# Patient Record
Sex: Female | Born: 1950 | Race: White | Hispanic: No | Marital: Married
Health system: Midwestern US, Community
[De-identification: ages and names within clinical notes are randomized; demographics above are authoritative.]

## PROBLEM LIST (undated history)

## (undated) DIAGNOSIS — Z1239 Encounter for other screening for malignant neoplasm of breast: Secondary | ICD-10-CM

## (undated) DIAGNOSIS — N838 Other noninflammatory disorders of ovary, fallopian tube and broad ligament: Secondary | ICD-10-CM

## (undated) DIAGNOSIS — Z139 Encounter for screening, unspecified: Secondary | ICD-10-CM

## (undated) DIAGNOSIS — Z1231 Encounter for screening mammogram for malignant neoplasm of breast: Secondary | ICD-10-CM

## (undated) DIAGNOSIS — R52 Pain, unspecified: Secondary | ICD-10-CM

---

## 2010-06-07 LAB — LACTATE DEHYDROGENASE: LD: 224 U/L — ABNORMAL HIGH (ref 100–190)

## 2011-05-19 LAB — LIPID PANEL
Cholesterol, Total: 172 mg/dl (ref 100–199)
HDL: 55 mg/dl
LDL Calculated: 106 mg/dl
Triglycerides: 54 mg/dl (ref 0–199)

## 2011-05-19 LAB — GLUCOSE, RANDOM: Glucose: 91 mg/dl (ref 70–108)

## 2012-02-14 LAB — CBC
Hematocrit: 37.6 % (ref 37.0–47.0)
Hemoglobin: 12.7 gm/dl (ref 12.0–16.0)
MCH: 32 pg — ABNORMAL HIGH (ref 27.0–31.0)
MCHC: 33.8 gm/dl (ref 33.0–37.0)
MCV: 94.8 fL (ref 81.0–99.0)
MPV: 9.1 mcm (ref 7.4–10.4)
Platelets: 198 10*3/uL (ref 130–400)
RBC: 3.96 10*6/uL — ABNORMAL LOW (ref 4.20–5.40)
RDW: 12.8 % (ref 11.5–14.5)
WBC: 6.4 10*3/uL (ref 4.8–10.8)

## 2012-02-14 LAB — SEDIMENTATION RATE: Sed Rate: 5 mm/hr (ref 0–20)

## 2012-02-17 LAB — ANA SCREEN WITH REFLEX: ANA SCREEN: NOT DETECTED

## 2012-02-17 LAB — TISSUE TRANSGLUTAMINASE, IGA

## 2014-09-21 ENCOUNTER — Encounter

## 2014-09-21 ENCOUNTER — Inpatient Hospital Stay: Admit: 2014-09-21 | Attending: Family Medicine

## 2014-09-21 DIAGNOSIS — Z139 Encounter for screening, unspecified: Secondary | ICD-10-CM

## 2016-03-22 ENCOUNTER — Inpatient Hospital Stay: Admit: 2016-03-22 | Attending: Family Medicine

## 2016-03-22 ENCOUNTER — Encounter

## 2016-03-22 DIAGNOSIS — Z139 Encounter for screening, unspecified: Secondary | ICD-10-CM

## 2016-10-11 ENCOUNTER — Inpatient Hospital Stay: Admit: 2016-10-11 | Payer: BLUE CROSS/BLUE SHIELD

## 2016-10-11 ENCOUNTER — Encounter

## 2016-10-11 DIAGNOSIS — N838 Other noninflammatory disorders of ovary, fallopian tube and broad ligament: Secondary | ICD-10-CM

## 2018-02-24 ENCOUNTER — Encounter

## 2018-02-24 ENCOUNTER — Inpatient Hospital Stay: Admit: 2018-02-24 | Payer: MEDICARE

## 2018-02-24 DIAGNOSIS — Z1231 Encounter for screening mammogram for malignant neoplasm of breast: Secondary | ICD-10-CM

## 2019-02-12 ENCOUNTER — Inpatient Hospital Stay: Payer: MEDICARE | Primary: Family Medicine

## 2019-02-12 DIAGNOSIS — L930 Discoid lupus erythematosus: Secondary | ICD-10-CM

## 2019-02-12 LAB — CBC WITH AUTO DIFFERENTIAL
Basophils Absolute: 0.1 10*3/uL (ref 0.0–0.1)
Basophils: 1.4 %
Eosinophils Absolute: 0.3 10*3/uL (ref 0.0–0.4)
Eosinophils: 6.2 %
Hematocrit: 39.5 % (ref 37.0–47.0)
Hemoglobin: 13.4 gm/dl (ref 12.0–16.0)
Immature Grans (Abs): 0.01 10*3/uL (ref 0.00–0.07)
Immature Granulocytes: 0.2 %
Lymphocytes Absolute: 0.5 10*3/uL — ABNORMAL LOW (ref 1.0–4.8)
Lymphocytes: 10.8 %
MCH: 31.9 pg (ref 26.0–33.0)
MCHC: 33.9 gm/dl (ref 32.2–35.5)
MCV: 94 fL (ref 81.0–99.0)
MPV: 10.6 fL (ref 9.4–12.4)
Monocytes Absolute: 0.4 10*3/uL (ref 0.4–1.3)
Monocytes: 8.6 %
Platelets: 206 10*3/uL (ref 130–400)
RBC: 4.2 10*6/uL (ref 4.20–5.40)
RDW-CV: 12.8 % (ref 11.5–14.5)
RDW-SD: 43.6 fL (ref 35.0–45.0)
Seg Neutrophils: 72.8 %
Segs Absolute: 3.6 10*3/uL (ref 1.8–7.7)
WBC: 5 10*3/uL (ref 4.8–10.8)
nRBC: 0 /100 wbc

## 2019-02-12 LAB — COMPREHENSIVE METABOLIC PANEL
ALT: 17 U/L (ref 11–66)
AST: 24 U/L (ref 5–40)
Albumin: 4.7 g/dL (ref 3.5–5.1)
Alkaline Phosphatase: 66 U/L (ref 38–126)
BUN: 9 mg/dL (ref 7–22)
CO2: 27 meq/L (ref 23–33)
Calcium: 9 mg/dL (ref 8.5–10.5)
Chloride: 97 meq/L — ABNORMAL LOW (ref 98–111)
Creatinine: 0.7 mg/dL (ref 0.4–1.2)
Glucose: 96 mg/dL (ref 70–108)
Potassium: 3.8 meq/L (ref 3.5–5.2)
Sodium: 134 meq/L — ABNORMAL LOW (ref 135–145)
Total Bilirubin: 0.8 mg/dL (ref 0.3–1.2)
Total Protein: 7.2 g/dL (ref 6.1–8.0)

## 2019-02-12 LAB — VITAMIN D 25 HYDROXY: Vit D, 25-Hydroxy: 48 ng/ml (ref 30–100)

## 2019-02-12 LAB — SEDIMENTATION RATE: Sed Rate: 6 mm/hr (ref 0–20)

## 2019-02-12 LAB — C-REACTIVE PROTEIN: CRP: <0.03 mg/dl (ref 0.00–1.00)

## 2019-02-12 LAB — ANION GAP: Anion Gap: 10 meq/L (ref 8.0–16.0)

## 2019-02-12 LAB — GLOMERULAR FILTRATION RATE, ESTIMATED: Est, Glom Filt Rate: 84 mL/min/{1.73_m2} — AB

## 2019-02-13 LAB — ANTI-CENTROMERE AB: CENTROMERE ANTIBODY: 6 U/mL (ref <100)

## 2019-02-14 LAB — CARDIOLIPIN ANTIBODY, IGG: Cardiolipin Antibody, IgG: 4 [GPL'U] (ref 0–14)

## 2019-02-14 LAB — C4 COMPLEMENT: C4 Complement: 23 mg/dL (ref 10–40)

## 2019-02-14 LAB — C3 COMPLEMENT: C3 Complement: 51 mg/dL — ABNORMAL LOW (ref 88–201)

## 2019-02-14 LAB — ANTI-DNA ANTIBODY, DOUBLE-STRANDED

## 2019-02-14 LAB — ANA SCREEN WITH REFLEX: ANA SCREEN: NOT DETECTED

## 2019-02-15 LAB — LUPUS (LE) PANEL W/ REFLEX
PTT 1:1 Mix: 48 s (ref 32–48)
PTT Lupus Anticoagulant: 64 s — ABNORMAL HIGH (ref 32–48)
Protime: 12.4 s (ref 12.0–15.5)
Thrombin Time: 17.5 s (ref 14.7–19.5)
dRVVT Screen: 28 s — ABNORMAL LOW (ref 33–44)

## 2019-02-15 LAB — SJOGREN'S ANTIBODIES (SS-A, SS-B): Anti SSB: 0 AU/mL (ref 0–40)

## 2019-02-15 LAB — COMPLEMENT, TOTAL: Compl, Total (CH50): 82 CAE Units (ref 60–144)

## 2019-02-15 LAB — SMITH (ENA) ANTIBODY IGG: Anti-Smith: 1 AU/mL (ref 0–40)

## 2019-02-15 LAB — ANTI-RNP (ENA AB): Anti RNP: 6 AU/mL (ref 0–40)

## 2019-02-16 LAB — SCLERODERMA (SCL-70) (ENA) AB: Scleroderma SCL-70: 36 U/mL (ref <100)

## 2019-07-15 ENCOUNTER — Inpatient Hospital Stay: Admit: 2019-07-15 | Payer: MEDICARE | Primary: Family Medicine

## 2019-07-15 ENCOUNTER — Encounter

## 2019-07-15 DIAGNOSIS — Z1231 Encounter for screening mammogram for malignant neoplasm of breast: Secondary | ICD-10-CM

## 2020-04-12 ENCOUNTER — Inpatient Hospital Stay: Payer: MEDICARE | Primary: Family Medicine

## 2020-04-12 DIAGNOSIS — M810 Age-related osteoporosis without current pathological fracture: Secondary | ICD-10-CM

## 2020-04-12 LAB — SEDIMENTATION RATE: Sed Rate: 5 mm/hr (ref 0–20)

## 2020-04-13 LAB — CBC WITH AUTO DIFFERENTIAL
Basophils Absolute: 0.1 10*3/uL (ref 0.0–0.1)
Basophils: 1.1 %
Eosinophils Absolute: 0.3 10*3/uL (ref 0.0–0.4)
Eosinophils: 4.8 %
Hematocrit: 40.3 % (ref 37.0–47.0)
Hemoglobin: 12.9 gm/dl (ref 12.0–16.0)
Immature Grans (Abs): 0.01 10*3/uL (ref 0.00–0.07)
Immature Granulocytes: 0.2 %
Lymphocytes Absolute: 0.7 10*3/uL — ABNORMAL LOW (ref 1.0–4.8)
Lymphocytes: 11.3 %
MCH: 31.9 pg (ref 26.0–33.0)
MCHC: 32 gm/dl — ABNORMAL LOW (ref 32.2–35.5)
MCV: 99.5 fL — ABNORMAL HIGH (ref 81.0–99.0)
MPV: 10.9 fL (ref 9.4–12.4)
Monocytes Absolute: 0.5 10*3/uL (ref 0.4–1.3)
Monocytes: 7.8 %
Platelets: 199 10*3/uL (ref 130–400)
RBC: 4.05 10*6/uL — ABNORMAL LOW (ref 4.20–5.40)
RDW-CV: 12.7 % (ref 11.5–14.5)
RDW-SD: 46.4 fL — ABNORMAL HIGH (ref 35.0–45.0)
Seg Neutrophils: 74.8 %
Segs Absolute: 4.7 10*3/uL (ref 1.8–7.7)
WBC: 6.3 10*3/uL (ref 4.8–10.8)
nRBC: 0 /100 wbc

## 2020-04-13 LAB — GLOMERULAR FILTRATION RATE, ESTIMATED: Est, Glom Filt Rate: 83 mL/min/{1.73_m2} — AB

## 2020-04-13 LAB — CREATININE: Creatinine: 0.7 mg/dL (ref 0.4–1.2)

## 2020-04-13 LAB — ALT: ALT: 16 U/L (ref 11–66)

## 2020-07-19 ENCOUNTER — Inpatient Hospital Stay: Admit: 2020-07-19 | Payer: MEDICARE | Primary: Family Medicine

## 2020-07-19 ENCOUNTER — Encounter

## 2020-07-19 ENCOUNTER — Ambulatory Visit: Payer: MEDICARE | Primary: Family Medicine

## 2020-07-19 DIAGNOSIS — Z1231 Encounter for screening mammogram for malignant neoplasm of breast: Secondary | ICD-10-CM

## 2020-07-19 LAB — SEDIMENTATION RATE: Sed Rate: 6 mm/hr (ref 0–20)

## 2020-07-20 LAB — CBC WITH AUTO DIFFERENTIAL
Basophils Absolute: 0 10*3/uL (ref 0.0–0.1)
Basophils: 0.8 %
Eosinophils Absolute: 0.1 10*3/uL (ref 0.0–0.4)
Eosinophils: 2.3 %
Hematocrit: 40.3 % (ref 37.0–47.0)
Hemoglobin: 13.4 gm/dl (ref 12.0–16.0)
Immature Grans (Abs): 0.02 10*3/uL (ref 0.00–0.07)
Immature Granulocytes: 0.3 %
Lymphocytes Absolute: 0.6 10*3/uL — ABNORMAL LOW (ref 1.0–4.8)
Lymphocytes: 10 %
MCH: 32.4 pg (ref 26.0–33.0)
MCHC: 33.3 gm/dl (ref 32.2–35.5)
MCV: 97.6 fL (ref 81.0–99.0)
MPV: 10.6 fL (ref 9.4–12.4)
Monocytes Absolute: 0.6 10*3/uL (ref 0.4–1.3)
Monocytes: 9.5 %
Platelets: 249 10*3/uL (ref 130–400)
RBC: 4.13 10*6/uL — ABNORMAL LOW (ref 4.20–5.40)
RDW-CV: 13.1 % (ref 11.5–14.5)
RDW-SD: 46.9 fL — ABNORMAL HIGH (ref 35.0–45.0)
Seg Neutrophils: 77.1 %
Segs Absolute: 4.7 10*3/uL (ref 1.8–7.7)
WBC: 6.1 10*3/uL (ref 4.8–10.8)
nRBC: 0 /100 wbc

## 2020-07-20 LAB — ALT: ALT: 17 U/L (ref 11–66)

## 2020-07-20 LAB — VITAMIN D 25 HYDROXY: Vit D, 25-Hydroxy: 78 ng/ml (ref 30–100)

## 2020-07-20 LAB — CALCIUM: Calcium: 9.8 mg/dL (ref 8.5–10.5)

## 2020-07-20 LAB — CREATININE: Creatinine: 0.6 mg/dL (ref 0.4–1.2)

## 2020-07-20 LAB — GLOMERULAR FILTRATION RATE, ESTIMATED: Est, Glom Filt Rate: >90 mL/min/{1.73_m2}

## 2021-04-18 ENCOUNTER — Inpatient Hospital Stay: Payer: MEDICARE

## 2021-04-18 DIAGNOSIS — Z79899 Other long term (current) drug therapy: Secondary | ICD-10-CM

## 2021-04-18 LAB — SEDIMENTATION RATE: Sed Rate: 7 mm/hr (ref 0–20)

## 2021-04-19 LAB — CBC WITH AUTO DIFFERENTIAL
Basophils Absolute: 0 10*3/uL (ref 0.0–0.1)
Basophils: 0.8 %
Eosinophils Absolute: 0.2 10*3/uL (ref 0.0–0.4)
Eosinophils: 4.1 %
Hematocrit: 39.5 % (ref 37.0–47.0)
Hemoglobin: 12.9 gm/dl (ref 12.0–16.0)
Immature Grans (Abs): 0.01 10*3/uL (ref 0.00–0.07)
Immature Granulocytes: 0.2 %
Lymphocytes Absolute: 0.7 10*3/uL — ABNORMAL LOW (ref 1.0–4.8)
Lymphocytes: 12.6 %
MCH: 32.2 pg (ref 26.0–33.0)
MCHC: 32.7 gm/dl (ref 32.2–35.5)
MCV: 98.5 fL (ref 81.0–99.0)
MPV: 10.4 fL (ref 9.4–12.4)
Monocytes Absolute: 0.5 10*3/uL (ref 0.4–1.3)
Monocytes: 10.2 %
Platelets: 252 10*3/uL (ref 130–400)
RBC: 4.01 10*6/uL — ABNORMAL LOW (ref 4.20–5.40)
RDW-CV: 12.6 % (ref 11.5–14.5)
RDW-SD: 45.5 fL — ABNORMAL HIGH (ref 35.0–45.0)
Seg Neutrophils: 72.1 %
Segs Absolute: 3.8 10*3/uL (ref 1.8–7.7)
WBC: 5.3 10*3/uL (ref 4.8–10.8)
nRBC: 0 /100 wbc

## 2021-04-19 LAB — CREATININE: Creatinine: 0.7 mg/dL (ref 0.4–1.2)

## 2021-04-19 LAB — ALT: ALT: 15 U/L (ref 11–66)

## 2021-04-19 LAB — GLOMERULAR FILTRATION RATE, ESTIMATED: Est, Glom Filt Rate: 83 mL/min/{1.73_m2} — AB

## 2021-07-24 ENCOUNTER — Inpatient Hospital Stay: Admit: 2021-07-24 | Payer: MEDICARE

## 2021-07-24 ENCOUNTER — Encounter

## 2021-07-24 DIAGNOSIS — Z1231 Encounter for screening mammogram for malignant neoplasm of breast: Secondary | ICD-10-CM

## 2021-08-04 ENCOUNTER — Inpatient Hospital Stay: Payer: MEDICARE

## 2021-08-04 DIAGNOSIS — M810 Age-related osteoporosis without current pathological fracture: Secondary | ICD-10-CM

## 2021-08-04 LAB — SEDIMENTATION RATE: Sed Rate: 8 mm/hr (ref 0–20)

## 2021-08-04 LAB — ALT: ALT: 14 U/L (ref 11–66)

## 2021-08-04 LAB — GLOMERULAR FILTRATION RATE, ESTIMATED: Est, Glom Filt Rate: >60 mL/min/{1.73_m2}

## 2021-08-04 LAB — CBC WITH AUTO DIFFERENTIAL
Basophils Absolute: 0 10*3/uL (ref 0.0–0.1)
Basophils: 0.8 %
Eosinophils Absolute: 0.2 10*3/uL (ref 0.0–0.4)
Eosinophils: 3.2 %
Hematocrit: 39.7 % (ref 37.0–47.0)
Hemoglobin: 13.3 gm/dl (ref 12.0–16.0)
Immature Grans (Abs): 0.01 10*3/uL (ref 0.00–0.07)
Immature Granulocytes: 0.2 %
Lymphocytes Absolute: 0.5 10*3/uL — ABNORMAL LOW (ref 1.0–4.8)
Lymphocytes: 8.6 %
MCH: 32 pg (ref 26.0–33.0)
MCHC: 33.5 gm/dl (ref 32.2–35.5)
MCV: 95.7 fL (ref 81.0–99.0)
MPV: 10.6 fL (ref 9.4–12.4)
Monocytes Absolute: 0.5 10*3/uL (ref 0.4–1.3)
Monocytes: 9.3 %
Platelets: 230 10*3/uL (ref 130–400)
RBC: 4.15 10*6/uL — ABNORMAL LOW (ref 4.20–5.40)
RDW-CV: 12.3 % (ref 11.5–14.5)
RDW-SD: 43.4 fL (ref 35.0–45.0)
Seg Neutrophils: 77.9 %
Segs Absolute: 4.6 10*3/uL (ref 1.8–7.7)
WBC: 5.9 10*3/uL (ref 4.8–10.8)
nRBC: 0 /100 wbc

## 2021-08-04 LAB — CREATININE: Creatinine: 0.8 mg/dL (ref 0.4–1.2)

## 2021-08-11 ENCOUNTER — Encounter: Payer: MEDICARE | Attending: Family Medicine

## 2021-10-31 IMAGING — DX HAND 3 VIEWS LEFT
1 series · 3 of 3 positions shown · non-contrast
Comparison: None.

________________________________________________________________________________________________ 
HAND 3 VIEWS LEFT, 10/31/2021 [DATE]: 
CLINICAL INDICATION: Left hand swelling. History of osteoporosis.

[Series 1: AP · 0.14mm/px · 3 of 3 slices shown]
[im 1/3]
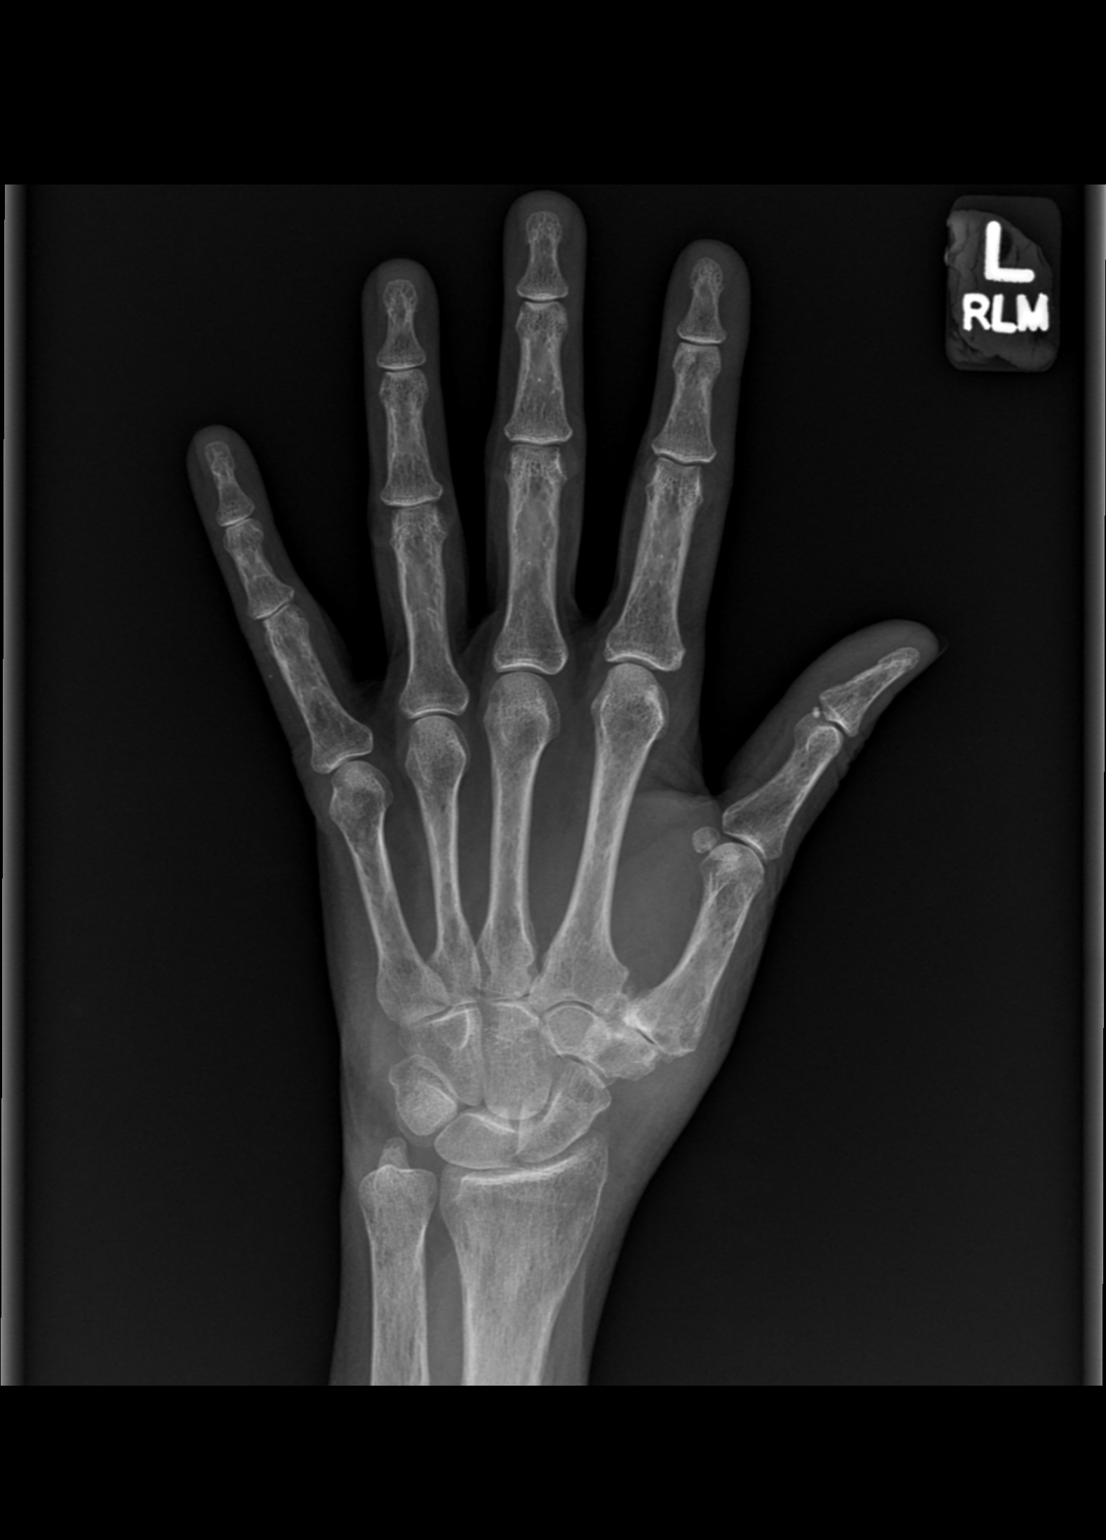
[im 2/3]
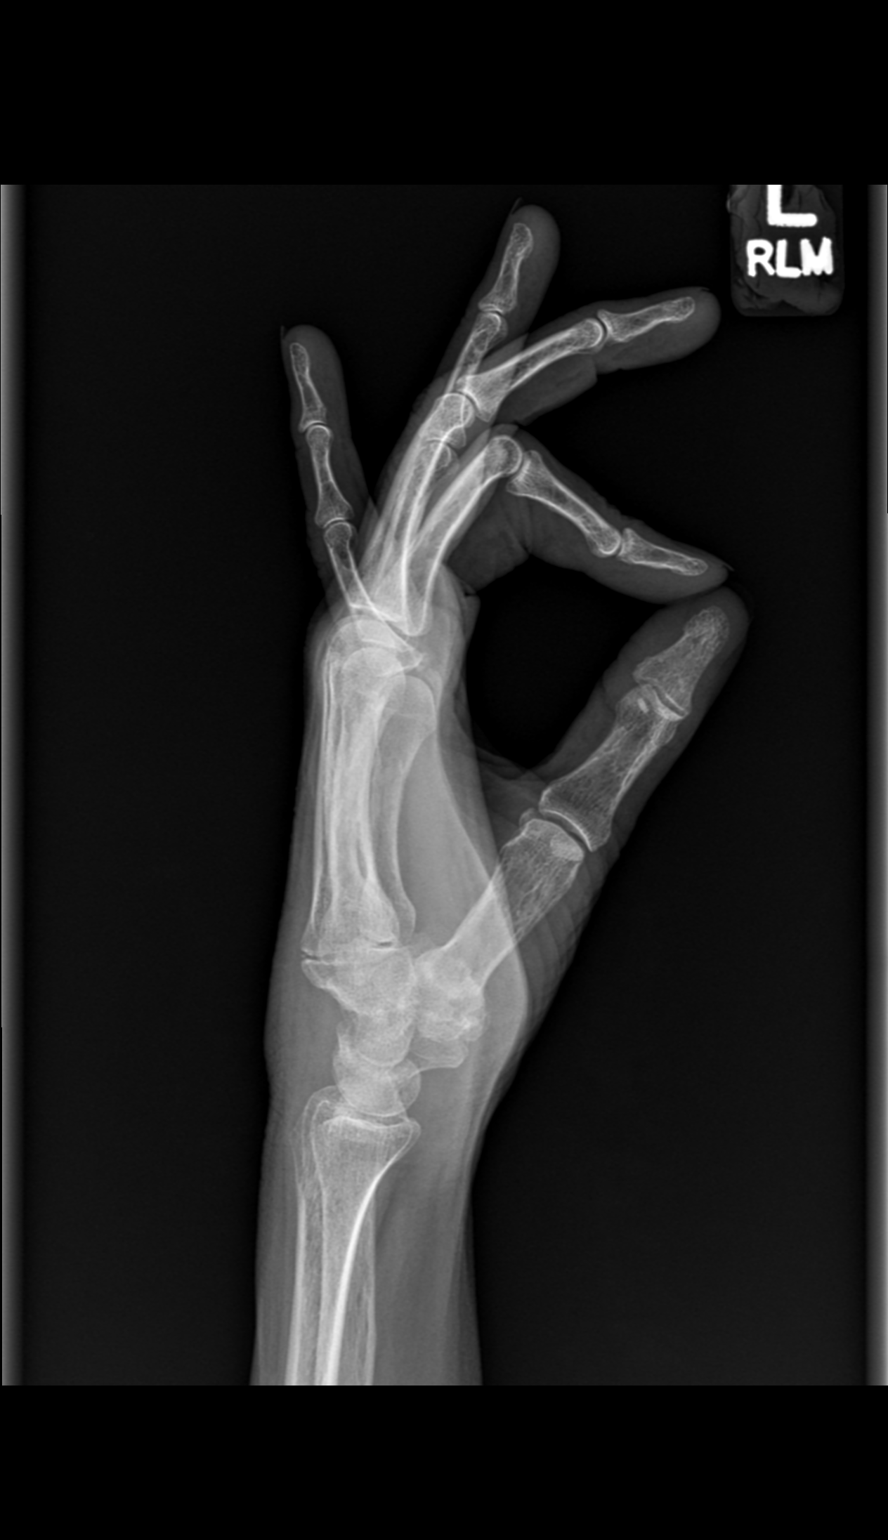
[im 3/3]
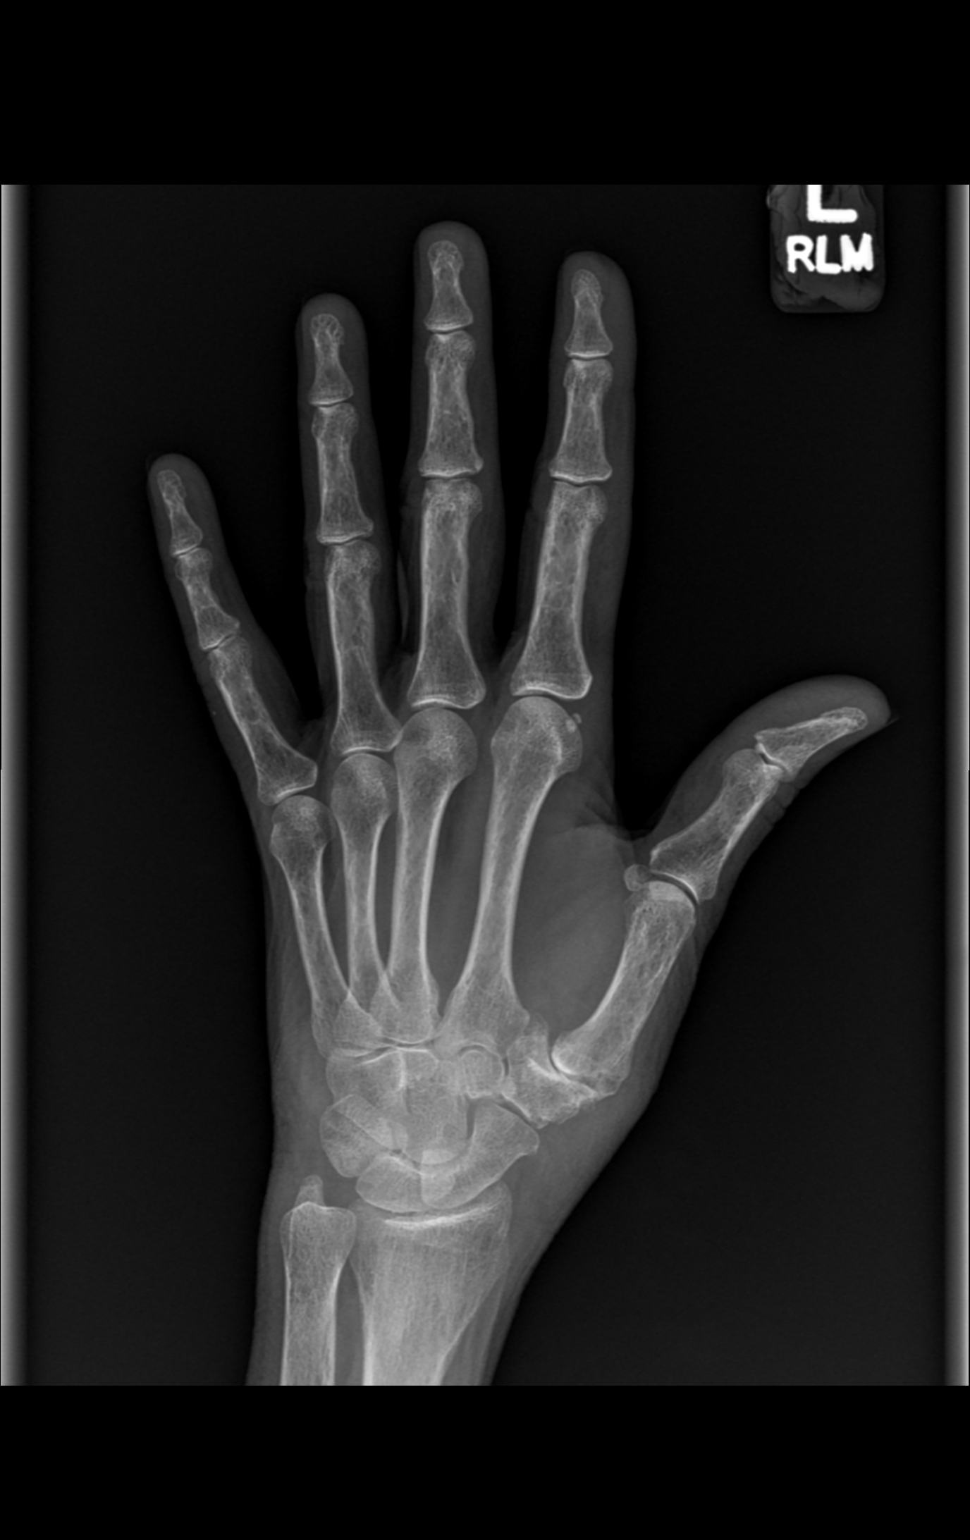

[3 of 3 positions shown; findings below may reference images not displayed]

FINDINGS: No fracture. Normal alignment. Advanced joint space narrowing of the 
first CMC articulation and mild involvement of the STT articulation. No 
fracture. Normal alignment. No erosion. No periostitis. No chondrocalcinosis.
IMPRESSION: Moderately advanced osteoarthritic changes.

## 2022-01-20 ENCOUNTER — Inpatient Hospital Stay: Payer: MEDICARE

## 2022-01-20 DIAGNOSIS — M810 Age-related osteoporosis without current pathological fracture: Secondary | ICD-10-CM

## 2022-01-20 LAB — CBC WITH AUTO DIFFERENTIAL
Basophils Absolute: 0.1 10*3/uL (ref 0.0–0.1)
Basophils: 0.9 %
Eosinophils Absolute: 0.3 10*3/uL (ref 0.0–0.4)
Eosinophils: 4 %
Hematocrit: 39.5 % (ref 37.0–47.0)
Hemoglobin: 13.2 gm/dl (ref 12.0–16.0)
Immature Grans (Abs): 0.01 10*3/uL (ref 0.00–0.07)
Immature Granulocytes: 0.2 %
Lymphocytes Absolute: 0.7 10*3/uL — ABNORMAL LOW (ref 1.0–4.8)
Lymphocytes: 10.2 %
MCH: 31.9 pg (ref 26.0–33.0)
MCHC: 33.4 gm/dl (ref 32.2–35.5)
MCV: 95.4 fL (ref 81.0–99.0)
MPV: 10.4 fL (ref 9.4–12.4)
Monocytes Absolute: 0.6 10*3/uL (ref 0.4–1.3)
Monocytes: 9.9 %
Platelets: 219 10*3/uL (ref 130–400)
RBC: 4.14 10*6/uL — ABNORMAL LOW (ref 4.20–5.40)
RDW-CV: 12.6 % (ref 11.5–14.5)
RDW-SD: 43.8 fL (ref 35.0–45.0)
Seg Neutrophils: 74.8 %
Segs Absolute: 4.8 10*3/uL (ref 1.8–7.7)
WBC: 6.4 10*3/uL (ref 4.8–10.8)
nRBC: 0 /100 wbc

## 2022-01-20 LAB — GLOMERULAR FILTRATION RATE, ESTIMATED: Est, Glom Filt Rate: >60 mL/min/{1.73_m2} (ref >60)

## 2022-01-20 LAB — CREATININE: Creatinine: 0.7 mg/dL (ref 0.4–1.2)

## 2022-01-20 LAB — SEDIMENTATION RATE: Sed Rate: 6 mm/hr (ref 0–20)

## 2022-01-20 LAB — ALT: ALT: 14 U/L (ref 11–66)

## 2022-01-24 ENCOUNTER — Inpatient Hospital Stay: Admit: 2022-01-24 | Payer: MEDICARE

## 2022-01-24 ENCOUNTER — Inpatient Hospital Stay: Payer: MEDICARE

## 2022-01-24 ENCOUNTER — Encounter

## 2022-01-24 DIAGNOSIS — R52 Pain, unspecified: Secondary | ICD-10-CM

## 2022-02-17 ENCOUNTER — Ambulatory Visit
Admit: 2022-02-17 | Discharge: 2022-02-17 | Payer: MEDICARE | Attending: Student in an Organized Health Care Education/Training Program

## 2022-02-17 DIAGNOSIS — J0140 Acute pansinusitis, unspecified: Secondary | ICD-10-CM

## 2022-02-17 MED ORDER — DOXYCYCLINE HYCLATE 100 MG PO TABS
100 MG | ORAL_TABLET | Freq: Two times a day (BID) | ORAL | 0 refills | Status: AC
Start: 2022-02-17 — End: 2022-02-27

## 2022-02-17 NOTE — Progress Notes (Signed)
[facility]             [address], [place], [place]  [address]                        Telephone [phone]             Fax [phone]       Janice Marsh  DOB:  [birth year]  Age:  71 y.o.   MRN:  [id number]  Date of visit:  02/17/2022     Assessment and Plan:    1. Acute non-recurrent pansinusitis  Likely acute sinusitis we will treat with doxycycline 1 mg twice daily today.  Return to care if symptoms worsen. Follow-up as needed for this issue.  Can use over-the-counter medications for symptom relief  - doxycycline hyclate (VIBRA-TABS) 100 MG tablet; Take 1 tablet by mouth 2 times daily for 10 days  Dispense: 20 tablet; Refill: 0      Subjective:    Janice Marsh is a 71 y.o. female who presents to [facility] today (02/17/2022) for evaluation of:  Sinus Problem (Started Tuesday. Congestion, sore throat, productive cough, sinus pain/pressure, ear pain. )    71 year old female presents the urgent care today for evaluation of sinus tenderness and pressure.  States that this has been ongoing for the past 7 days or so.  She states she is having a lot of sinus pain and pressure that seems to be worsening over the past couple days.  Having a lot of congestion as well as a sore throat and a mildly productive cough.  Has not tried any over-the-counter medications.  Chief Complaint   Patient presents with    Sinus Problem     Started Tuesday. Congestion, sore throat, productive cough, sinus pain/pressure, ear pain.      She has the following problem list:  There is no problem list on file for this patient.       Review of Systems   Constitutional:  Positive for fatigue. Negative for chills and fever.   HENT:  Positive for congestion, ear pain, rhinorrhea, sinus pressure, sinus pain and sore throat. Negative for dental problem, postnasal drip and trouble swallowing.    Eyes:  Negative for pain and visual disturbance.   Respiratory:   Positive for cough. Negative for shortness of breath.    Cardiovascular:  Negative for chest pain and palpitations.   Gastrointestinal:  Negative for abdominal pain, blood in stool, constipation, diarrhea, nausea and vomiting.   Genitourinary:  Negative for dysuria and urgency.   Skin:  Negative for rash and wound.   Neurological:  Negative for dizziness and headaches.   Psychiatric/Behavioral:  Negative for dysphoric mood. The patient is not nervous/anxious.       Current medications are:  Current Outpatient Medications   Medication Sig Dispense Refill    hydroxychloroquine (PLAQUENIL) 200 MG tablet TAKE ONE TABLET BY MOUTH EVERY MORNING AND TAKE ONE-HALF TABLET BY MOUTH EVERY EVENING ON TUESDAYS, THURSDAYS, SATURDAYS, AND SUNDAYS      melatonin 3 MG TABS tablet Take 1 tablet by mouth      doxycycline hyclate (VIBRA-TABS) 100 MG tablet Take 1 tablet by mouth 2 times daily for 10 days 20 tablet 0     No current facility-administered medications for this visit.        She is allergic to quinacrine, sulfamethoxazole, and trimethoprim.    She  reports that she has never smoked. She has never been exposed to tobacco smoke. She has never used smokeless tobacco.      Objective:    Vitals:    02/17/22 1127   BP: 116/72   Site: Left Upper Arm   Position: Sitting   Cuff Size: Medium Adult   Pulse: 79   Resp: 20   Temp: 97.8 F (36.6 C)   TempSrc: Tympanic   SpO2: 98%   Weight: 127 lb 8 oz (57.8 kg)   Height: 5' 5.5" (1.664 m)     Body mass index is 20.89 kg/m.    Physical Exam  Vitals and nursing note reviewed.   Constitutional:       General: She is not in acute distress.     Appearance: She is well-developed. She is not diaphoretic.   HENT:      Head: Normocephalic and atraumatic.      Right Ear: Tympanic membrane and external ear normal.      Left Ear: Tympanic membrane and external ear normal.      Nose: Congestion and rhinorrhea present.      Mouth/Throat:      Pharynx: Posterior oropharyngeal erythema present.    Eyes:      General: No scleral icterus.        Right eye: No discharge.         Left eye: No discharge.      Conjunctiva/sclera: Conjunctivae normal.   Cardiovascular:      Rate and Rhythm: Normal rate and regular rhythm.      Heart sounds: Normal heart sounds. No murmur heard.  Pulmonary:      Effort: Pulmonary effort is normal.      Breath sounds: Normal breath sounds.   Musculoskeletal:      Cervical back: Normal range of motion.   Skin:     General: Skin is warm and dry.      Findings: No erythema or rash.   Neurological:      Mental Status: She is alert and oriented to person, place, and time.      Cranial Nerves: No cranial nerve deficit.   Psychiatric:         Behavior: Behavior normal.         Thought Content: Thought content normal.         Judgment: Judgment normal.             (Please note that portions of this note were completed with a voice-recognition program. Efforts were made to edit the dictation but occasionally words are mis-transcribed.)

## 2022-05-08 ENCOUNTER — Inpatient Hospital Stay: Payer: MEDICARE

## 2022-05-08 DIAGNOSIS — M810 Age-related osteoporosis without current pathological fracture: Secondary | ICD-10-CM

## 2022-05-08 LAB — SEDIMENTATION RATE: Sed Rate: 6 mm/hr (ref 0–20)

## 2022-05-09 LAB — CBC WITH AUTO DIFFERENTIAL
Basophils Absolute: 0.1 10*3/uL (ref 0.0–0.1)
Basophils: 1.1 %
Eosinophils Absolute: 0.4 10*3/uL (ref 0.0–0.4)
Eosinophils: 7 %
Hematocrit: 41.7 % (ref 37.0–47.0)
Hemoglobin: 13.7 gm/dl (ref 12.0–16.0)
Immature Grans (Abs): 0.01 10*3/uL (ref 0.00–0.07)
Immature Granulocytes: 0.2 %
Lymphocytes Absolute: 0.6 10*3/uL — ABNORMAL LOW (ref 1.0–4.8)
Lymphocytes: 10.4 %
MCH: 32.3 pg (ref 26.0–33.0)
MCHC: 32.9 gm/dl (ref 32.2–35.5)
MCV: 98.3 fL (ref 81.0–99.0)
MPV: 10.8 fL (ref 9.4–12.4)
Monocytes Absolute: 0.6 10*3/uL (ref 0.4–1.3)
Monocytes: 9.9 %
Platelets: 223 10*3/uL (ref 130–400)
RBC: 4.24 10*6/uL (ref 4.20–5.40)
RDW-CV: 13.2 % (ref 11.5–14.5)
RDW-SD: 47.9 fL — ABNORMAL HIGH (ref 35.0–45.0)
Seg Neutrophils: 71.4 %
Segs Absolute: 4 10*3/uL (ref 1.8–7.7)
WBC: 5.6 10*3/uL (ref 4.8–10.8)
nRBC: 0 /100 wbc

## 2022-05-09 LAB — VITAMIN D 25 HYDROXY: Vit D, 25-Hydroxy: 47 ng/ml (ref 30–100)

## 2022-05-09 LAB — CREATININE: Creatinine: 0.6 mg/dL (ref 0.4–1.2)

## 2022-05-09 LAB — GLOMERULAR FILTRATION RATE, ESTIMATED: Est, Glom Filt Rate: >60 mL/min/{1.73_m2} (ref >60)

## 2022-05-09 LAB — ALT: ALT: 21 U/L (ref 11–66)

## 2022-07-19 ENCOUNTER — Encounter: Admit: 2022-07-19 | Payer: MEDICARE

## 2022-07-19 LAB — CBC WITH AUTO DIFFERENTIAL
Basophils Absolute: 0.1 10*3/uL (ref 0.0–0.1)
Basophils: 0.7 %
Eosinophils Absolute: 0.5 10*3/uL — ABNORMAL HIGH (ref 0.0–0.4)
Eosinophils: 6 %
Hematocrit: 30 % — ABNORMAL LOW (ref 37.0–47.0)
Hemoglobin: 10.3 gm/dl — ABNORMAL LOW (ref 12.0–16.0)
Immature Grans (Abs): 0.02 10*3/uL (ref 0.00–0.07)
Immature Granulocytes: 0.3 %
Lymphocytes Absolute: 0.7 10*3/uL — ABNORMAL LOW (ref 1.0–4.8)
Lymphocytes: 9.7 %
MCH: 34.6 pg — ABNORMAL HIGH (ref 26.0–33.0)
MCHC: 34.3 gm/dl (ref 32.2–35.5)
MCV: 100.7 fL — ABNORMAL HIGH (ref 81.0–99.0)
MPV: 9.3 fL — ABNORMAL LOW (ref 9.4–12.4)
Monocytes Absolute: 0.5 10*3/uL (ref 0.4–1.3)
Monocytes: 6.8 %
Platelets: 352 10*3/uL (ref 130–400)
RBC: 2.98 10*6/uL — ABNORMAL LOW (ref 4.20–5.40)
RDW-CV: 14.8 % — ABNORMAL HIGH (ref 11.5–14.5)
RDW-SD: 50.9 fL — ABNORMAL HIGH (ref 35.0–45.0)
Seg Neutrophils: 76.5 %
Segs Absolute: 5.7 10*3/uL (ref 1.8–7.7)
WBC: 7.5 10*3/uL (ref 4.8–10.8)
nRBC: 0 /100 wbc

## 2022-07-19 LAB — BASIC METABOLIC PANEL-WITHOUT GLUCOSE
BUN: 13 mg/dL (ref 7–22)
CO2: 27 meq/L (ref 23–33)
Calcium: 9.3 mg/dL (ref 8.5–10.5)
Chloride: 98 meq/L (ref 98–111)
Creatinine: 0.6 mg/dL (ref 0.4–1.2)
Potassium: 4.4 meq/L (ref 3.5–5.2)
Sodium: 134 meq/L — ABNORMAL LOW (ref 135–145)

## 2022-07-19 LAB — ANION GAP: Anion Gap: 9 meq/L (ref 8.0–16.0)

## 2022-07-19 LAB — C-REACTIVE PROTEIN: CRP: 1.07 mg/dl — ABNORMAL HIGH (ref 0.00–1.00)

## 2022-07-19 LAB — GLOMERULAR FILTRATION RATE, ESTIMATED: Est, Glom Filt Rate: >60 mL/min/{1.73_m2} (ref >60)

## 2022-07-19 LAB — VITAMIN D 25 HYDROXY: Vit D, 25-Hydroxy: 54 ng/ml (ref 30–100)

## 2022-07-19 LAB — SEDIMENTATION RATE: Sed Rate: 114 mm/hr — ABNORMAL HIGH (ref 0–20)

## 2022-07-19 LAB — MAGNESIUM: Magnesium: 2.3 mg/dL (ref 1.6–2.4)

## 2022-07-25 ENCOUNTER — Encounter

## 2022-07-25 ENCOUNTER — Inpatient Hospital Stay: Admit: 2022-07-25 | Payer: MEDICARE

## 2022-07-25 DIAGNOSIS — Z1239 Encounter for other screening for malignant neoplasm of breast: Secondary | ICD-10-CM

## 2022-07-26 ENCOUNTER — Encounter: Admit: 2022-07-26 | Payer: MEDICARE

## 2022-07-26 LAB — FERRITIN: Ferritin: 149 ng/mL (ref 10–291)

## 2022-07-26 LAB — IRON AND TIBC
Iron: 61 ug/dL (ref 50–170)
TIBC: 255 ug/dL (ref 171–450)

## 2022-07-26 LAB — VITAMIN B12: Vitamin B-12: 1846 pg/mL — ABNORMAL HIGH (ref 211–911)

## 2022-07-26 LAB — RETICULOCYTES
Absolute Retic #: 191 10*3/uL — ABNORMAL HIGH (ref 20.0–115.0)
Immature Retic Fract: 21.6 % — ABNORMAL HIGH (ref 3.0–15.9)
Retic Ct Abs: 5.9 % — ABNORMAL HIGH (ref 0.5–2.0)
Retic Hemoglobin: 36.2 pg — ABNORMAL HIGH (ref 28.2–35.7)

## 2022-07-26 LAB — FOLATE: Folate: >20 ng/mL (ref 4.8–24.2)

## 2022-07-26 LAB — IRON SATURATION: Iron Saturation: 24 % (ref 20–50)

## 2022-07-29 LAB — HAPTOGLOBIN: Haptoglobin: 83 mg/dL (ref 30–200)

## 2022-07-30 ENCOUNTER — Encounter: Admit: 2022-07-30 | Payer: MEDICARE

## 2022-07-30 LAB — BLOOD OCCULT STOOL SCREEN #1: Occult Blood Fecal: NEGATIVE

## 2022-08-16 ENCOUNTER — Inpatient Hospital Stay: Payer: MEDICARE

## 2022-08-16 DIAGNOSIS — M810 Age-related osteoporosis without current pathological fracture: Secondary | ICD-10-CM

## 2022-08-17 LAB — URINE WITH REFLEXED MICRO
Bacteria, UA: NONE SEEN /hpf
Bilirubin Urine: NEGATIVE
Blood, Urine: NEGATIVE
CASTS 2: NONE SEEN /lpf
Casts UA: NONE SEEN /lpf
Crystals, UA: NONE SEEN
Epithelial Cells, UA: NONE SEEN /hpf (ref 3–?)
Glucose, Ur: NEGATIVE mg/dl
Ketones, Urine: NEGATIVE
MISCELLANEOUS 2: NONE SEEN
Nitrite, Urine: NEGATIVE
Protein, UA: NEGATIVE
Renal Epithelial, UA: NONE SEEN
Specific Gravity, Urine: 1.013 (ref 1.002–1.030)
Urobilinogen, Urine: 0.2 eu/dl (ref 0.0–1.0)
Yeast, UA: NONE SEEN
pH, UA: 6.5 (ref 5.0–9.0)

## 2022-10-11 ENCOUNTER — Inpatient Hospital Stay: Payer: MEDICARE

## 2022-10-11 DIAGNOSIS — M810 Age-related osteoporosis without current pathological fracture: Secondary | ICD-10-CM

## 2022-10-11 LAB — SEDIMENTATION RATE: Sed Rate: 14 mm/hr (ref 0–20)

## 2022-10-12 LAB — CBC WITH AUTO DIFFERENTIAL
Basophils Absolute: 0 10*3/uL (ref 0.0–0.1)
Basophils: 0.8 %
Eosinophils Absolute: 0.2 10*3/uL (ref 0.0–0.4)
Eosinophils: 4.1 %
Hematocrit: 38.8 % (ref 37.0–47.0)
Hemoglobin: 12.6 gm/dl (ref 12.0–16.0)
Immature Grans (Abs): 0.01 10*3/uL (ref 0.00–0.07)
Immature Granulocytes: 0.2 %
Lymphocytes Absolute: 0.4 10*3/uL — ABNORMAL LOW (ref 1.0–4.8)
Lymphocytes: 7.8 %
MCH: 33.3 pg — ABNORMAL HIGH (ref 26.0–33.0)
MCHC: 32.5 gm/dl (ref 32.2–35.5)
MCV: 102.6 fL — ABNORMAL HIGH (ref 81.0–99.0)
MPV: 9.9 fL (ref 9.4–12.4)
Monocytes Absolute: 0.3 10*3/uL — ABNORMAL LOW (ref 0.4–1.3)
Monocytes: 6.6 %
Platelets: 247 10*3/uL (ref 130–400)
RBC: 3.78 10*6/uL — ABNORMAL LOW (ref 4.20–5.40)
RDW-CV: 11.9 % (ref 11.5–14.5)
RDW-SD: 45.3 fL — ABNORMAL HIGH (ref 35.0–45.0)
Seg Neutrophils: 80.5 %
Segs Absolute: 3.9 10*3/uL (ref 1.8–7.7)
WBC: 4.9 10*3/uL (ref 4.8–10.8)
nRBC: 0 /100 wbc

## 2022-10-12 LAB — GLOMERULAR FILTRATION RATE, ESTIMATED: Est, Glom Filt Rate: >60 mL/min/{1.73_m2} (ref >60)

## 2022-10-12 LAB — CREATININE: Creatinine: 0.7 mg/dL (ref 0.4–1.2)

## 2022-10-12 LAB — ALT: ALT: 18 U/L (ref 11–66)

## 2022-12-31 IMAGING — DX CHEST PA AND LATERAL
1 series · 2 of 2 positions shown · non-contrast
Comparison: None.

________________________________________________________________________________________________ 
CLINICAL INDICATION: Recent hospitalization with pericarditis and pleural 
effusions..

[Series 1: PA · 0.14mm/px · 2 of 2 slices shown]
[im 1/2]
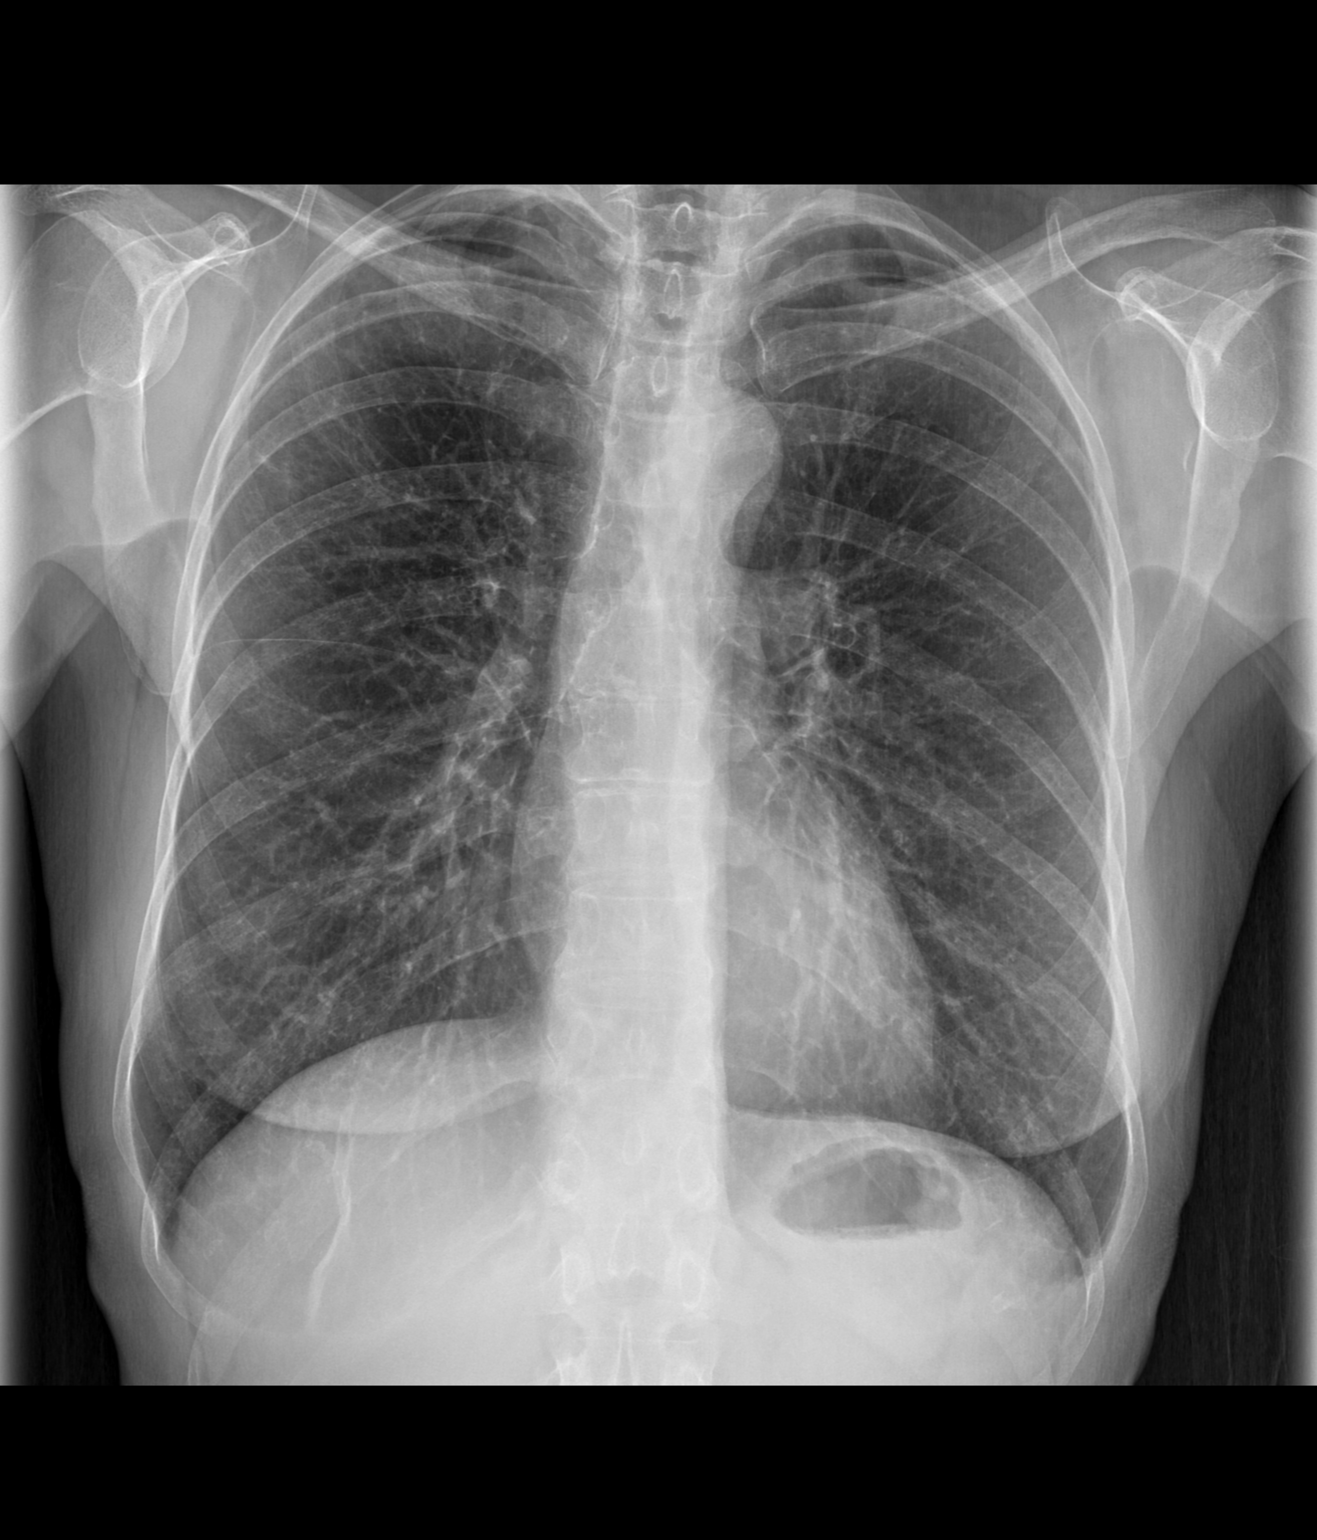
[im 2/2]
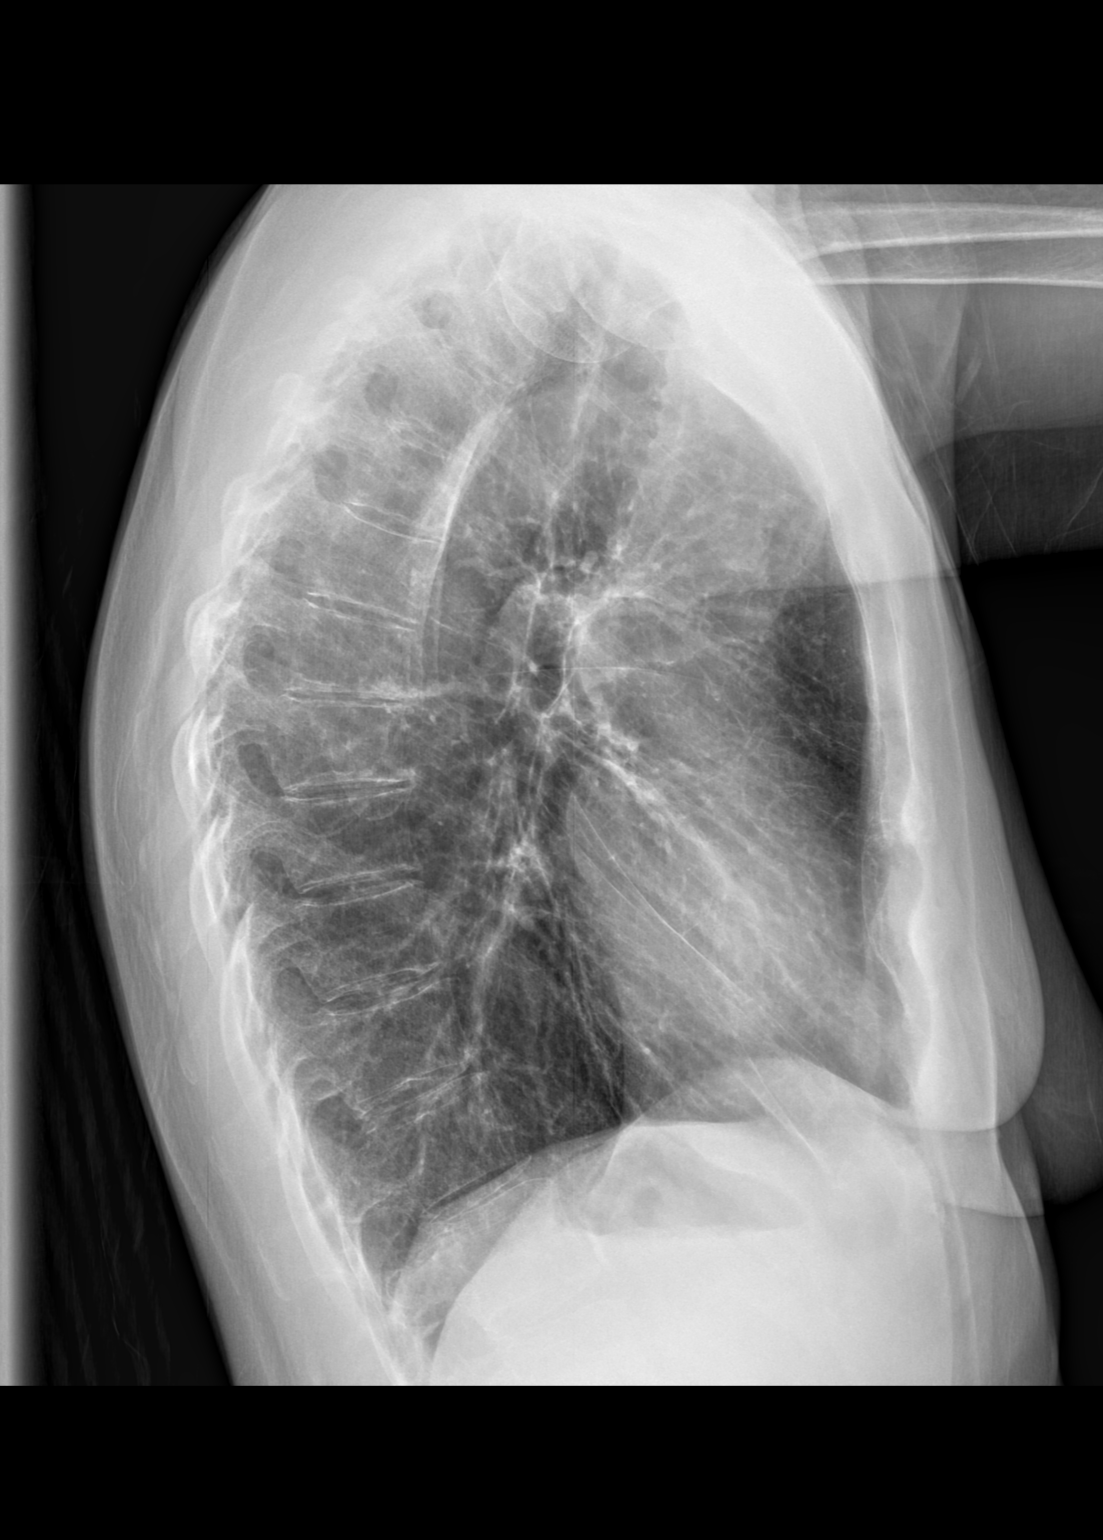

[2 of 2 positions shown; findings below may reference images not displayed]

FINDINGS: Mild linear atelectasis right lung base. No mass or consolidation. No 
pleural effusion.
IMPRESSION: Mild basilar atelectasis.

## 2023-01-17 ENCOUNTER — Inpatient Hospital Stay: Payer: MEDICARE

## 2023-01-17 DIAGNOSIS — M329 Systemic lupus erythematosus, unspecified: Secondary | ICD-10-CM

## 2023-01-17 LAB — URINALYSIS WITH REFLEX TO CULTURE
Bilirubin Urine: NEGATIVE
Blood, Urine: NEGATIVE
Glucose, Ur: NEGATIVE mg/dl
Ketones, Urine: NEGATIVE
Leukocyte Esterase, Urine: NEGATIVE
Nitrite, Urine: NEGATIVE
Protein, UA: NEGATIVE
Specific Gravity, Urine: 1.012 (ref 1.002–1.030)
Urobilinogen, Urine: 0.2 eu/dl (ref 0.0–1.0)
pH, UA: 6.5 (ref 5.0–9.0)

## 2023-03-29 ENCOUNTER — Inpatient Hospital Stay: Payer: MEDICARE

## 2023-03-29 DIAGNOSIS — M810 Age-related osteoporosis without current pathological fracture: Secondary | ICD-10-CM

## 2023-03-29 LAB — URINALYSIS WITH REFLEX TO CULTURE
Amorphous, UA: NONE SEEN
Bilirubin, Urine: NEGATIVE
Blood, Urine: NEGATIVE
Casts UA: NONE SEEN /lpf
Crystals, UA: NONE SEEN
Glucose, Ur: NEGATIVE mg/dl
Ketones, Urine: NEGATIVE
Leukocyte Esterase, Urine: NEGATIVE
Mucus, UA: NONE SEEN
Nitrite, Urine: NEGATIVE
Protein, UA: NEGATIVE mg/dl
Specific Gravity, UA: 1.02 (ref 1.002–1.030)
Urobilinogen, Urine: 0.2 eu/dl (ref 0.0–1.0)
pH, Urine: 7 (ref 5.0–9.0)

## 2023-03-29 LAB — SEDIMENTATION RATE: Sed Rate, Automated: 23 mm/hr — ABNORMAL HIGH (ref 0–20)

## 2023-03-30 LAB — CBC WITH AUTO DIFFERENTIAL
Basophils Absolute: 0 10*3/uL (ref 0.0–0.1)
Basophils: 0.8 %
Eosinophils Absolute: 0.3 10*3/uL (ref 0.0–0.4)
Eosinophils: 4.5 %
Hematocrit: 36.3 % — ABNORMAL LOW (ref 37.0–47.0)
Hemoglobin: 12.4 gm/dl (ref 12.0–16.0)
Immature Grans (Abs): 0.01 10*3/uL (ref 0.00–0.07)
Immature Granulocytes %: 0.2 %
Lymphocytes Absolute: 0.7 10*3/uL — ABNORMAL LOW (ref 1.0–4.8)
Lymphocytes: 11.5 %
MCH: 32.5 pg (ref 26.0–33.0)
MCHC: 34.2 gm/dl (ref 32.2–35.5)
MCV: 95 fL (ref 81.0–99.0)
MPV: 10.1 fL (ref 9.4–12.4)
Monocytes %: 9 %
Monocytes Absolute: 0.5 10*3/uL (ref 0.4–1.3)
Neutrophils Absolute: 4.4 10*3/uL (ref 1.8–7.7)
Platelets: 254 10*3/uL (ref 130–400)
RBC: 3.82 10*6/uL — ABNORMAL LOW (ref 4.20–5.40)
RDW-CV: 12.3 % (ref 11.5–14.5)
RDW-SD: 42.8 fL (ref 35.0–45.0)
Seg Neutrophils: 74 %
WBC: 6 10*3/uL (ref 4.8–10.8)
nRBC: 0 /100 wbc

## 2023-03-30 LAB — COMPREHENSIVE METABOLIC PANEL
ALT: 17 U/L (ref 11–66)
AST: 32 U/L (ref 5–40)
Albumin: 4.3 g/dL (ref 3.5–5.1)
Alkaline Phosphatase: 77 U/L (ref 38–126)
BUN: 21 mg/dL (ref 7–22)
CO2: 27 meq/L (ref 23–33)
Calcium: 9 mg/dL (ref 8.5–10.5)
Chloride: 97 meq/L — ABNORMAL LOW (ref 98–111)
Creatinine: 0.7 mg/dL (ref 0.4–1.2)
Glucose: 92 mg/dL (ref 70–108)
Potassium: 4.6 meq/L (ref 3.5–5.2)
Sodium: 134 meq/L — ABNORMAL LOW (ref 135–145)
Total Bilirubin: 0.5 mg/dL (ref 0.3–1.2)
Total Protein: 7 g/dL (ref 6.1–8.0)

## 2023-03-30 LAB — GLOMERULAR FILTRATION RATE, ESTIMATED: Est, Glom Filt Rate: >90 mL/min/{1.73_m2} (ref >60)

## 2023-03-30 LAB — TSH WITH REFLEX: TSH: 2.68 u[IU]/mL (ref 0.400–4.200)

## 2023-03-30 LAB — ANION GAP: Anion Gap: 10 meq/L (ref 8.0–16.0)

## 2023-03-31 LAB — C4 COMPLEMENT: Complement C4: 26 mg/dL (ref 10–40)

## 2023-03-31 LAB — C3 COMPLEMENT: C3 Complement: 50 mg/dL — ABNORMAL LOW (ref 90–180)

## 2023-03-31 LAB — IGA: IgA: 190 mg/dL (ref 70–400)

## 2023-04-01 LAB — TISSUE TRANSGLUTAMINASE, IGA: Tissue Transglutaminase IgA: <0.1 U/mL (ref <7.0)

## 2023-04-01 LAB — COMPLEMENT, TOTAL: Compl, Total (CH50): 36.5 U/mL — ABNORMAL LOW (ref 38.7–89.9)

## 2023-07-03 ENCOUNTER — Inpatient Hospital Stay: Payer: MEDICARE

## 2023-07-03 DIAGNOSIS — M810 Age-related osteoporosis without current pathological fracture: Secondary | ICD-10-CM

## 2023-07-03 LAB — SEDIMENTATION RATE: Sed Rate, Automated: 15 mm/h (ref 0–20)

## 2023-07-04 LAB — CBC WITH AUTO DIFFERENTIAL
Basophils Absolute: 0.1 10*3/uL (ref 0.0–0.1)
Basophils: 0.8 %
Eosinophils Absolute: 0.4 10*3/uL (ref 0.0–0.4)
Eosinophils: 4.3 %
Hematocrit: 39.3 % (ref 37.0–47.0)
Hemoglobin: 13 g/dL (ref 12.0–16.0)
Immature Grans (Abs): 0.01 10*3/uL (ref 0.00–0.07)
Immature Granulocytes %: 0.1 %
Lymphocytes Absolute: 0.7 10*3/uL — ABNORMAL LOW (ref 1.0–4.8)
Lymphocytes: 8.2 %
MCH: 30.8 pg (ref 26.0–33.0)
MCHC: 33.1 g/dL (ref 32.2–35.5)
MCV: 93.1 fL (ref 81.0–99.0)
MPV: 11 fL (ref 9.4–12.4)
Monocytes %: 7.2 %
Monocytes Absolute: 0.6 10*3/uL (ref 0.4–1.3)
Neutrophils Absolute: 6.6 10*3/uL (ref 1.8–7.7)
Platelets: 157 10*3/uL (ref 130–400)
RBC: 4.22 10*6/uL (ref 4.20–5.40)
RDW-CV: 13.1 % (ref 11.5–14.5)
RDW-SD: 44.3 fL (ref 35.0–45.0)
Seg Neutrophils: 79.4 %
WBC: 8.3 10*3/uL (ref 4.8–10.8)
nRBC: 0 /100{WBCs}

## 2023-07-04 LAB — URINALYSIS WITH REFLEX TO CULTURE
Bilirubin, Urine: NEGATIVE
Blood, Urine: NEGATIVE
Glucose, Ur: NEGATIVE mg/dL
Ketones, Urine: NEGATIVE
Leukocyte Esterase, Urine: NEGATIVE
Nitrite, Urine: NEGATIVE
Protein, UA: NEGATIVE
Specific Gravity, UA: 1.012 (ref 1.002–1.030)
Urobilinogen, Urine: 0.2 eu/dl (ref 0.0–1.0)
pH, Urine: 7.5 (ref 5.0–9.0)

## 2023-07-04 LAB — ALT: ALT: 18 U/L (ref 11–66)

## 2023-07-04 LAB — CREATININE: Creatinine: 0.8 mg/dL (ref 0.4–1.2)

## 2023-07-04 LAB — GLOMERULAR FILTRATION RATE, ESTIMATED: Est, Glom Filt Rate: 79 mL/min/{1.73_m2} (ref >60)

## 2023-07-05 LAB — C4 COMPLEMENT: Complement C4: 25 mg/dL (ref 10–40)

## 2023-07-05 LAB — C3 COMPLEMENT: C3 Complement: 60 mg/dL — ABNORMAL LOW (ref 90–180)

## 2023-07-06 LAB — COMPLEMENT, TOTAL: Compl, Total (CH50): 43.8 U/mL (ref 38.7–89.9)

## 2023-11-25 IMAGING — MR MRI RIGHT SHOULDER WITH AND WITHOUT CONTRAST
6 of 9 series · 26 of 40 positions shown · IV contrast (gadavist)
Comparison: None

________________________________________________________________________________________________ 
MRI RIGHT SHOULDER WITH AND WITHOUT CONTRAST, 11/25/2023 [DATE]: 
CLINICAL INDICATION: Shoulder pain
TECHNIQUE: Multiplanar, multiecho position MR images of the shoulder were 
performed without and with intravenous Gadavist. 5.5 mL of Gadavist were 
injected intravenously by hand. 2 mL of Gadavist discarded. Patient was scanned 
on a 1.5T magnet.

[Series 201: survey right · axial · right · 10.0mm · 0.71mm/px · z∈[-40,+125]mm · 3 of 15 slices shown]
[im 1/15]
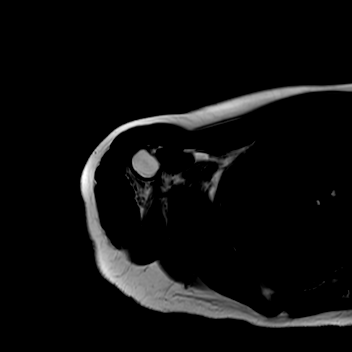
[im 8/15]
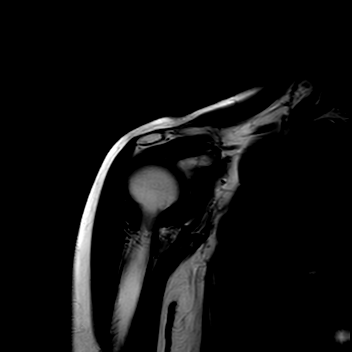
[im 15/15]
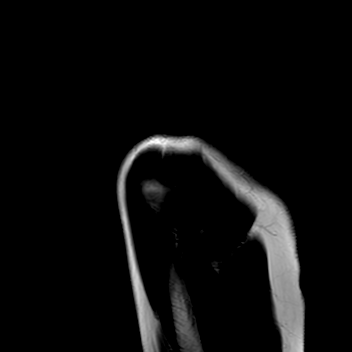

[Series 301: (person_name)_(person_name)_(person_name) right · axial · right · 3.5mm · 0.42mm/px · z∈[-55,+51]mm · 5 of 28 slices shown]
[im 1/28]
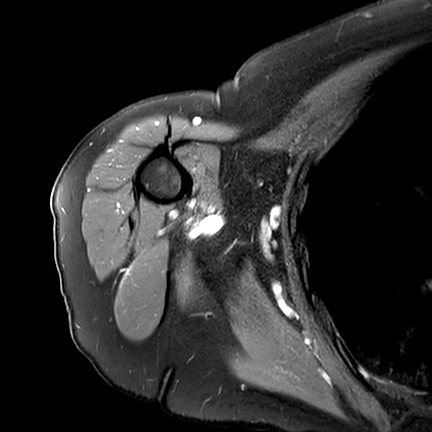
[im 7/28]
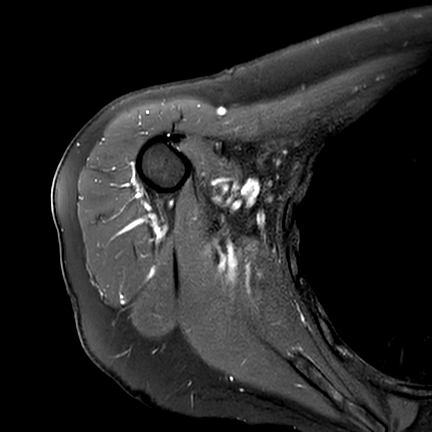
[im 14/28]
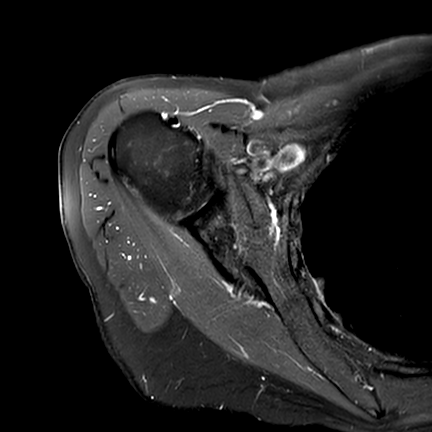
[im 21/28]
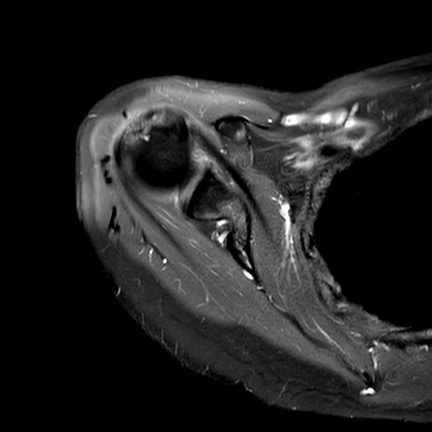
[im 28/28]
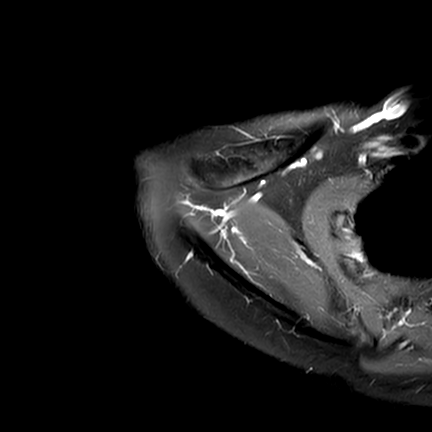

[Series 401: t2_fs_sag right · oblique · right · 3.5mm · 0.37mm/px · 5 of 30 slices shown]
[im 1/30]
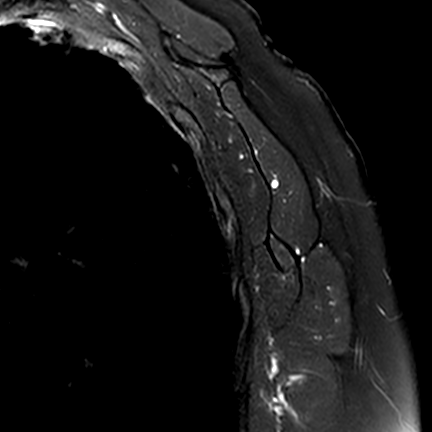
[im 8/30]
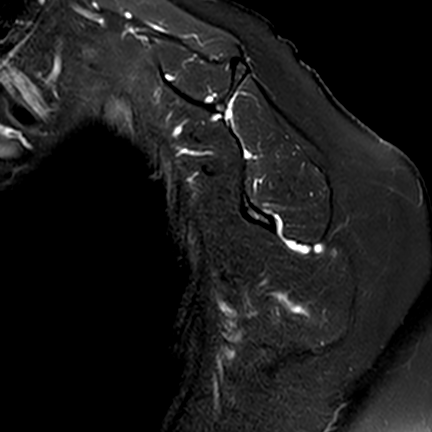
[im 15/30]
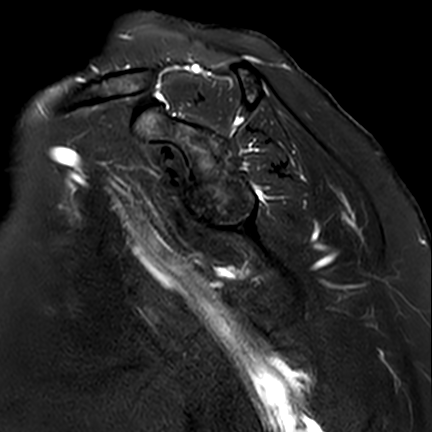
[im 22/30]
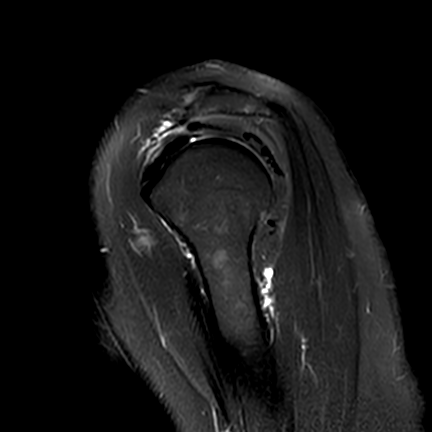
[im 30/30]
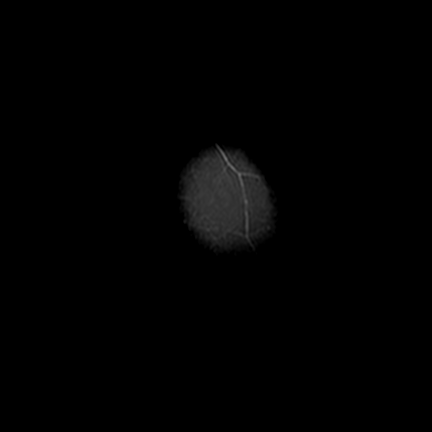

[Series 501: pd_fs_cor right · oblique · right · 3.5mm · 0.40mm/px · 4 of 24 slices shown]
[im 1/24]
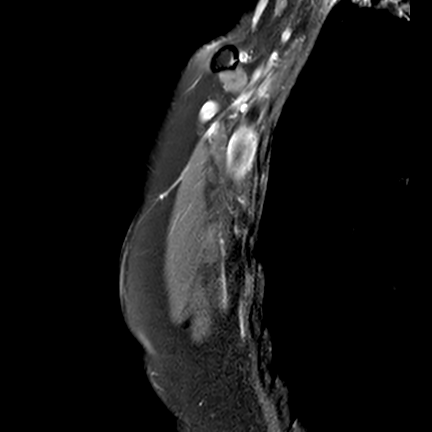
[im 8/24]
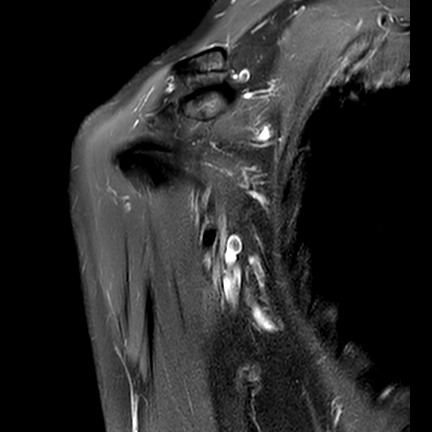
[im 16/24]
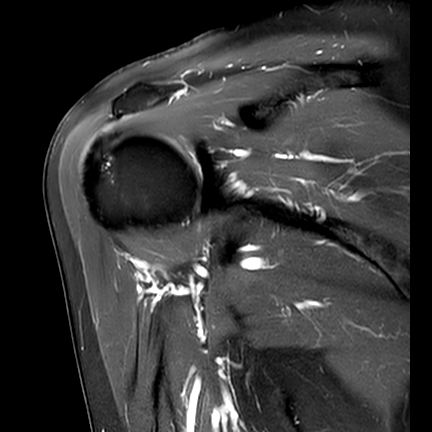
[im 24/24]
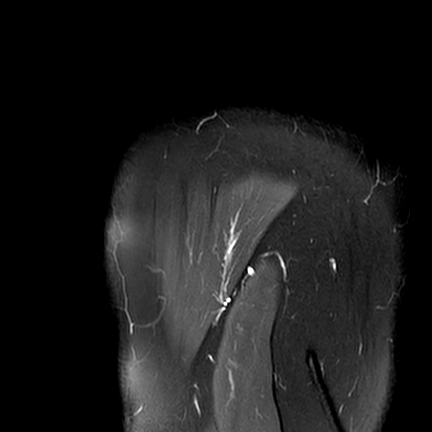

[Series 601: t1_sag right · oblique · right · 3.5mm · 0.31mm/px · 5 of 30 slices shown]
[im 1/30]
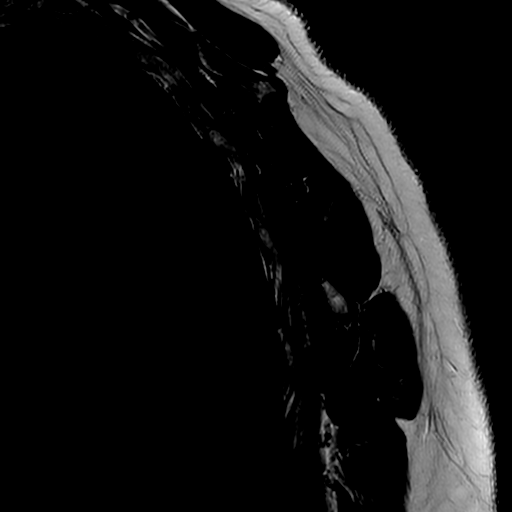
[im 8/30]
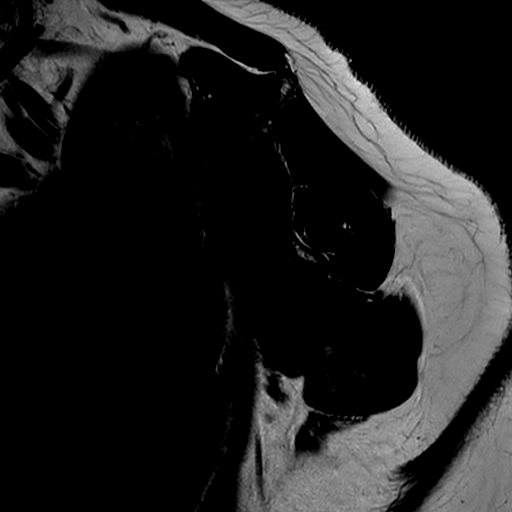
[im 15/30]
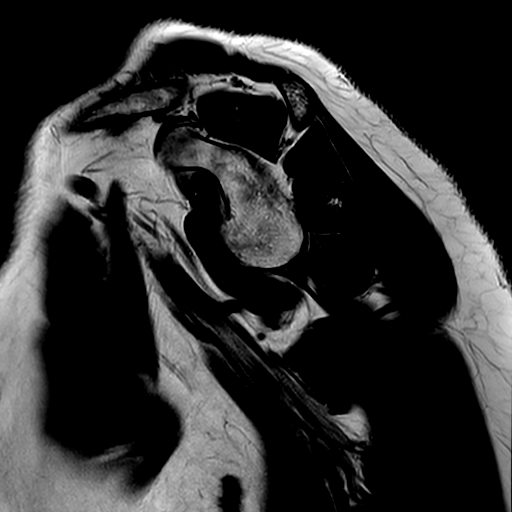
[im 22/30]
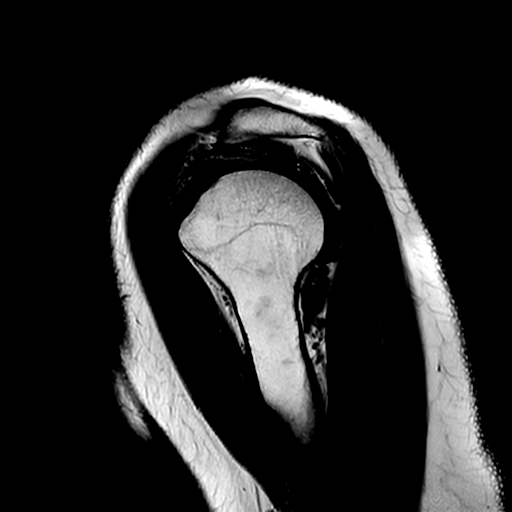
[im 30/30]
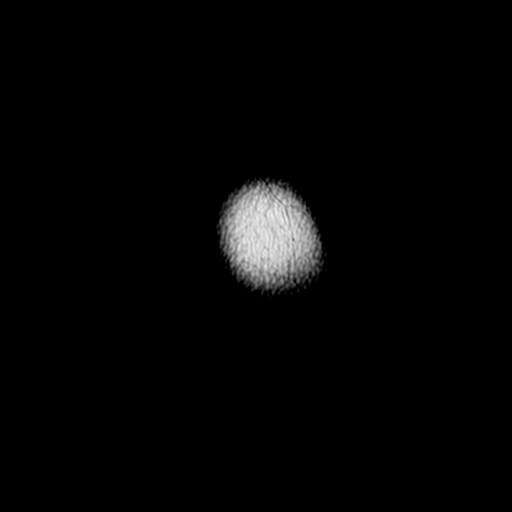

[Series 1001: T1 · oblique · right · 3.0mm · 0.55mm/px · 4 of 22 slices shown]
[im 1/22]
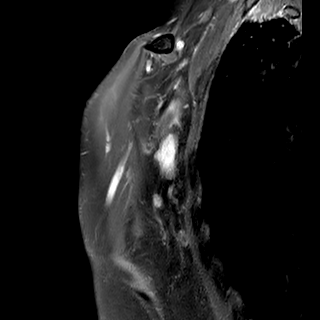
[im 8/22]
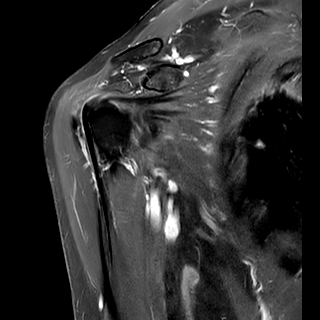
[im 15/22]
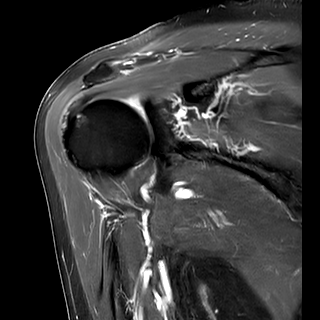
[im 22/22]
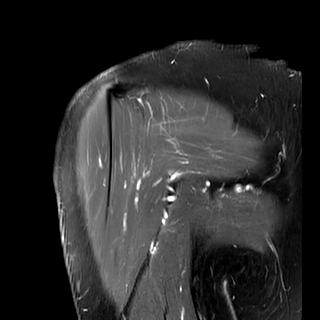

[26 of 40 positions shown; findings below may reference images not displayed]

FINDINGS: ROTATOR CUFF: Several small low-grade partial-thickness articular sided distal 
supraspinatus tendon tears and tendinosis. The infraspinatus, subscapularis and 
teres minor tendons are intact. The rotator cuff musculature is symmetric 
without mass, signal abnormality or atrophy. 
ACROMIOCLAVICULAR JOINT: Normal AC joint. The coracoacromial ligament is intact 
without prominent spurring at the acromial attachment. The acromioclavicular and 
coracoclavicular ligaments are preserved. The acromium is normal in morphology. 
GLENOHUMERAL JOINT: The humeral head is well located within the glenoid fossa. 
Articular cartilage is preserved.  Degenerative superior labral tear. No 
paralabral cyst. The intra-articular portion of the long head of the biceps 
tendon is negative. No shoulder joint effusion. 
BONES: The bone marrow signal intensity is negative for fracture. No Hill-Sachs 
defect. Subcortical cystic change of the lateral humeral head with minimal 
surrounding bone marrow edema and enhancement. 
ADDITIONAL FINDINGS: The axillary region is negative. Subcutaneous tissues are 
negative.
IMPRESSION: 1.  Several small low-grade partial-thickness articular sided distal 
supraspinatus tendon tears and tendinosis.  
2.  Degenerative superior labral tear.  
3.  Subcortical cystic change of the lateral humeral head.

## 2024-02-03 ENCOUNTER — Inpatient Hospital Stay: Payer: MEDICARE

## 2024-02-03 DIAGNOSIS — M810 Age-related osteoporosis without current pathological fracture: Secondary | ICD-10-CM

## 2024-02-03 LAB — ANION GAP: Anion Gap: 9 meq/L (ref 8.0–16.0)

## 2024-02-03 LAB — URINALYSIS WITH REFLEX TO CULTURE
Bilirubin, Urine: NEGATIVE
Blood, Urine: NEGATIVE
Glucose, Ur: NEGATIVE mg/dL
Ketones, Urine: NEGATIVE
Leukocyte Esterase, Urine: NEGATIVE
Nitrite, Urine: NEGATIVE
Protein, UA: NEGATIVE
Specific Gravity, UA: 1.021 (ref 1.002–1.030)
Urobilinogen, Urine: 0.2 eu/dl (ref 0.0–1.0)
pH, Urine: 6.5 (ref 5.0–9.0)

## 2024-02-03 LAB — CBC WITH AUTO DIFFERENTIAL
Basophils Absolute: 0.1 10*3/uL (ref 0.0–0.1)
Basophils: 1.4 %
Eosinophils Absolute: 0.3 10*3/uL (ref 0.0–0.4)
Eosinophils: 7.1 %
Hematocrit: 38.5 % (ref 37.0–47.0)
Hemoglobin: 13.1 g/dL (ref 12.0–16.0)
Immature Grans (Abs): 0.01 10*3/uL (ref 0.00–0.07)
Immature Granulocytes %: 0.2 %
Lymphocytes Absolute: 0.7 10*3/uL — ABNORMAL LOW (ref 1.0–4.8)
Lymphocytes: 15.5 %
MCH: 32.5 pg (ref 26.0–33.0)
MCHC: 34 g/dL (ref 32.2–35.5)
MCV: 95.5 fL (ref 81.0–99.0)
MPV: 11.5 fL (ref 9.4–12.4)
Monocytes %: 9.1 %
Monocytes Absolute: 0.4 10*3/uL (ref 0.4–1.3)
Neutrophils Absolute: 2.9 10*3/uL (ref 1.8–7.7)
Platelet Estimate: DECREASED
Platelets: 98 10*3/uL — ABNORMAL LOW (ref 130–400)
RBC: 4.03 10*6/uL — ABNORMAL LOW (ref 4.20–5.40)
RDW-CV: 13.1 % (ref 11.5–14.5)
RDW-SD: 46.4 fL — ABNORMAL HIGH (ref 35.0–45.0)
Seg Neutrophils: 66.7 %
WBC: 4.4 10*3/uL — ABNORMAL LOW (ref 4.8–10.8)
nRBC: 0 /100{WBCs}

## 2024-02-03 LAB — COMPREHENSIVE METABOLIC PANEL
ALT: 23 U/L (ref 10–35)
AST: 30 U/L (ref 10–35)
Albumin: 4.3 g/dL (ref 3.4–4.9)
Alkaline Phosphatase: 63 U/L (ref 38–126)
BUN: 19 mg/dL (ref 8–23)
CO2: 28 meq/L (ref 22–29)
Calcium: 9.3 mg/dL (ref 8.8–10.2)
Chloride: 101 meq/L (ref 98–111)
Creatinine: 0.7 mg/dL (ref 0.5–0.9)
Glucose: 103 mg/dL (ref 74–109)
Potassium: 4.4 meq/L (ref 3.5–5.2)
Sodium: 138 meq/L (ref 135–145)
Total Bilirubin: 0.7 mg/dL (ref 0.3–1.2)
Total Protein: 6.6 g/dL (ref 6.4–8.3)

## 2024-02-03 LAB — GLOMERULAR FILTRATION RATE, ESTIMATED: Est, Glom Filt Rate: >90 mL/min/{1.73_m2} (ref >60)

## 2024-02-03 LAB — SEDIMENTATION RATE: Sed Rate, Automated: 6 mm/h (ref 0–20)

## 2024-02-03 LAB — SCAN OF BLOOD SMEAR

## 2024-02-04 LAB — C3 COMPLEMENT: C3 Complement: 51 mg/dL — ABNORMAL LOW (ref 90–180)

## 2024-02-04 LAB — C4 COMPLEMENT: Complement C4: 20 mg/dL (ref 10–40)

## 2024-02-05 LAB — COMPLEMENT, TOTAL: Compl, Total (CH50): 41.8 U/mL (ref 38.7–89.9)

## 2024-02-13 ENCOUNTER — Inpatient Hospital Stay: Admit: 2024-02-13 | Payer: MEDICARE

## 2024-02-13 ENCOUNTER — Encounter

## 2024-02-13 VITALS — Ht 66.0 in | Wt 128.0 lb

## 2024-02-13 DIAGNOSIS — Z1239 Encounter for other screening for malignant neoplasm of breast: Secondary | ICD-10-CM

## 2024-03-06 LAB — CBC WITH AUTO DIFFERENTIAL
Basophils Absolute: 0.1 10*3/uL (ref 0.0–0.1)
Basophils: 1.4 %
Eosinophils Absolute: 0.4 10*3/uL (ref 0.0–0.4)
Eosinophils: 6.6 %
Hematocrit: 39.5 % (ref 37.0–47.0)
Hemoglobin: 13.1 g/dL (ref 12.0–16.0)
Immature Grans (Abs): 0.02 10*3/uL (ref 0.00–0.07)
Immature Granulocytes %: 0.3 %
Lymphocytes Absolute: 0.7 10*3/uL — ABNORMAL LOW (ref 1.0–4.8)
Lymphocytes: 12.2 %
MCH: 32.4 pg (ref 26.0–33.0)
MCHC: 33.2 g/dL (ref 32.2–35.5)
MCV: 97.8 fL (ref 81.0–99.0)
MPV: 14.2 fL — ABNORMAL HIGH (ref 9.4–12.4)
Monocytes %: 11.2 %
Monocytes Absolute: 0.7 10*3/uL (ref 0.4–1.3)
Neutrophils Absolute: 4 10*3/uL (ref 1.8–7.7)
Platelet Estimate: DECREASED
Platelets: 42 10*3/uL — ABNORMAL LOW (ref 130–400)
RBC: 4.04 10*6/uL — ABNORMAL LOW (ref 4.20–5.40)
RDW-CV: 13.1 % (ref 11.5–14.5)
RDW-SD: 47 fL — ABNORMAL HIGH (ref 35.0–45.0)
Seg Neutrophils: 68.3 %
WBC: 5.9 10*3/uL (ref 4.8–10.8)
nRBC: 0 /100{WBCs}

## 2024-03-06 LAB — SCAN OF BLOOD SMEAR

## 2024-03-20 ENCOUNTER — Inpatient Hospital Stay: Payer: MEDICARE

## 2024-03-20 DIAGNOSIS — M810 Age-related osteoporosis without current pathological fracture: Secondary | ICD-10-CM

## 2024-03-20 LAB — SCAN OF BLOOD SMEAR

## 2024-03-21 LAB — CBC WITH AUTO DIFFERENTIAL
Basophils Absolute: 0.1 10*3/uL (ref 0.0–0.1)
Basophils: 1.2 %
Eosinophils Absolute: 0.3 10*3/uL (ref 0.0–0.4)
Eosinophils: 5.1 %
Hematocrit: 39.4 % (ref 37.0–47.0)
Hemoglobin: 13.5 g/dL (ref 12.0–16.0)
Immature Grans (Abs): 0.02 10*3/uL (ref 0.00–0.07)
Immature Granulocytes %: 0.3 %
Lymphocytes Absolute: 0.7 10*3/uL — ABNORMAL LOW (ref 1.0–4.8)
Lymphocytes: 10.4 %
MCH: 33.2 pg — ABNORMAL HIGH (ref 26.0–33.0)
MCHC: 34.3 g/dL (ref 32.2–35.5)
MCV: 96.8 fL (ref 81.0–99.0)
MPV: 13.1 fL — ABNORMAL HIGH (ref 9.4–12.4)
Monocytes %: 10.1 %
Monocytes Absolute: 0.7 10*3/uL (ref 0.4–1.3)
Neutrophils Absolute: 4.7 10*3/uL (ref 1.8–7.7)
Platelet Estimate: DECREASED
Platelets: 20 10*3/uL — CL (ref 130–400)
RBC: 4.07 10*6/uL — ABNORMAL LOW (ref 4.20–5.40)
RDW-CV: 12.8 % (ref 11.5–14.5)
RDW-SD: 46.2 fL — ABNORMAL HIGH (ref 35.0–45.0)
Seg Neutrophils: 72.9 %
WBC: 6.5 10*3/uL (ref 4.8–10.8)
nRBC: 0 /100{WBCs}

## 2024-03-24 ENCOUNTER — Inpatient Hospital Stay: Payer: MEDICARE

## 2024-03-24 DIAGNOSIS — M810 Age-related osteoporosis without current pathological fracture: Secondary | ICD-10-CM

## 2024-03-24 LAB — HEPATITIS C ANTIBODY: Hepatitis C Ab: NONREACTIVE

## 2024-03-24 LAB — HEPATITIS B SURFACE ANTIGEN: Hepatitis B Surface Ag: NONREACTIVE

## 2024-03-24 LAB — HEPATITIS B CORE ANTIBODY, IGM: Hep B Core Ab, IgM: NONREACTIVE

## 2024-03-24 LAB — HEPATITIS B SURFACE ANTIBODY: Hep B S Ab: <3.5 m[IU]/mL — AB

## 2024-03-27 LAB — QUANTIFERON (R) TB GOLD
QuantiFERON Nil: 0.03 [IU]/mL
QuantiFERON-TB minus NIL: 4.17 [IU]/mL
Quantiferon Plus 1-Tube TB2 minus NIL: 0.03 [IU]/mL (ref <=0.34)
Quantiferon Plus TB1 minus NIL: 0.03 [IU]/mL (ref <=0.34)
Quantiferon TB Gold Plus: NEGATIVE

## 2024-04-06 LAB — CBC WITH AUTO DIFFERENTIAL
Basophils Absolute: 0.1 10*3/uL (ref 0.0–0.1)
Basophils: 0.6 %
Eosinophils Absolute: 0.2 10*3/uL (ref 0.0–0.4)
Eosinophils: 2.3 %
Hematocrit: 41.6 % (ref 37.0–47.0)
Hemoglobin: 13.5 g/dL (ref 12.0–16.0)
Immature Grans (Abs): 0.03 10*3/uL (ref 0.00–0.07)
Immature Granulocytes %: 0.3 %
Lymphocytes Absolute: 1.1 10*3/uL (ref 1.0–4.8)
Lymphocytes: 12.7 %
MCH: 32.1 pg (ref 26.0–33.0)
MCHC: 32.5 g/dL (ref 32.2–35.5)
MCV: 98.8 fL (ref 81.0–99.0)
MPV: 13.8 fL — ABNORMAL HIGH (ref 9.4–12.4)
Monocytes %: 6.6 %
Monocytes Absolute: 0.6 10*3/uL (ref 0.4–1.3)
Neutrophils Absolute: 6.8 10*3/uL (ref 1.8–7.7)
Platelet Estimate: DECREASED
Platelets: 40 10*3/uL — ABNORMAL LOW (ref 130–400)
RBC: 4.21 10*6/uL (ref 4.20–5.40)
RDW-CV: 12.9 % (ref 11.5–14.5)
RDW-SD: 46.5 fL — ABNORMAL HIGH (ref 35.0–45.0)
Seg Neutrophils: 77.5 %
WBC: 8.8 10*3/uL (ref 4.8–10.8)
nRBC: 0 /100{WBCs}

## 2024-04-06 LAB — COMPREHENSIVE METABOLIC PANEL
ALT: 20 U/L (ref 10–35)
AST: 23 U/L (ref 10–35)
Albumin: 4.1 g/dL (ref 3.4–4.9)
Alkaline Phosphatase: 65 U/L (ref 38–126)
BUN: 16 mg/dL (ref 8–23)
CO2: 28 meq/L (ref 22–29)
Calcium: 9.4 mg/dL (ref 8.8–10.2)
Chloride: 101 meq/L (ref 98–111)
Creatinine: 0.7 mg/dL (ref 0.5–0.9)
Glucose: 90 mg/dL (ref 74–109)
Potassium: 4.2 meq/L (ref 3.5–5.2)
Sodium: 138 meq/L (ref 135–145)
Total Bilirubin: 0.6 mg/dL (ref 0.3–1.2)
Total Protein: 6.4 g/dL (ref 6.4–8.3)

## 2024-04-06 LAB — URINE WITH REFLEXED MICRO
Bacteria, UA: NONE SEEN /HPF
Bilirubin, Urine: NEGATIVE
Blood, Urine: NEGATIVE
CASTS 2: NONE SEEN /LPF
Casts UA: NONE SEEN /LPF
Crystals, UA: NONE SEEN
Glucose, Ur: NEGATIVE mg/dL
Ketones, Urine: NEGATIVE
Leukocyte Esterase, Urine: NEGATIVE
MISCELLANEOUS 2: NONE SEEN
Nitrite, Urine: NEGATIVE
Protein, UA: NEGATIVE
Renal Epithelial, UA: NONE SEEN
Specific Gravity, UA: 1.011 (ref 1.002–1.030)
Urobilinogen, Urine: 0.2 eu/dl (ref 0.0–1.0)
Yeast, UA: NONE SEEN
pH, Urine: 8 (ref 5.0–9.0)

## 2024-04-06 LAB — SEDIMENTATION RATE: Sed Rate, Automated: 4 mm/h (ref 0–20)

## 2024-04-06 LAB — ANION GAP: Anion Gap: 9 meq/L (ref 8.0–16.0)

## 2024-04-06 LAB — GLOMERULAR FILTRATION RATE, ESTIMATED: Est, Glom Filt Rate: >90 mL/min/{1.73_m2} (ref >60)

## 2024-04-06 LAB — SCAN OF BLOOD SMEAR

## 2024-04-07 LAB — C3 COMPLEMENT: C3 Complement: 53 mg/dL — ABNORMAL LOW (ref 90–180)

## 2024-04-07 LAB — C4 COMPLEMENT: Complement C4: 17 mg/dL (ref 10–40)

## 2024-04-08 LAB — COMPLEMENT, TOTAL: Compl, Total (CH50): 49.7 U/mL (ref 38.7–89.9)

## 2024-04-27 LAB — CBC WITH AUTO DIFFERENTIAL
Basophils Absolute: 0 thou/mm3 (ref 0.0–0.1)
Basophils: 0.4 %
Eosinophils Absolute: 0.1 thou/mm3 (ref 0.0–0.4)
Eosinophils: 2.3 %
Hematocrit: 41.5 % (ref 37.0–47.0)
Hemoglobin: 13.9 g/dL (ref 12.0–16.0)
Immature Grans (Abs): 0.01 thou/mm3 (ref 0.00–0.07)
Immature Granulocytes %: 0.2 %
Lymphocytes Absolute: 0.8 thou/mm3 — ABNORMAL LOW (ref 1.0–4.8)
Lymphocytes: 16 %
MCH: 32.8 pg (ref 26.0–33.0)
MCHC: 33.5 g/dL (ref 32.2–35.5)
MCV: 97.9 fL (ref 81.0–99.0)
MPV: 12.8 fL — ABNORMAL HIGH (ref 9.4–12.4)
Monocytes %: 12.2 %
Monocytes Absolute: 0.6 thou/mm3 (ref 0.4–1.3)
Neutrophils Absolute: 3.3 thou/mm3 (ref 1.8–7.7)
Platelets: 32 thou/mm3 — ABNORMAL LOW (ref 130–400)
RBC: 4.24 mill/mm3 (ref 4.20–5.40)
RDW-CV: 12.7 % (ref 11.5–14.5)
RDW-SD: 45.5 fL — ABNORMAL HIGH (ref 35.0–45.0)
Seg Neutrophils: 68.9 %
WBC: 4.8 thou/mm3 (ref 4.8–10.8)
nRBC: 0 /100{WBCs}

## 2024-04-27 LAB — SEDIMENTATION RATE: Sed Rate, Automated: 7 mm/h (ref 0–20)

## 2024-04-27 LAB — COMPREHENSIVE METABOLIC PANEL
ALT: 20 U/L (ref 10–35)
AST: 25 U/L (ref 10–35)
Albumin: 4.1 g/dL (ref 3.4–4.9)
Alkaline Phosphatase: 70 U/L (ref 38–126)
BUN: 12 mg/dL (ref 8–23)
CO2: 28 meq/L (ref 22–29)
Calcium: 9.1 mg/dL (ref 8.8–10.2)
Chloride: 99 meq/L (ref 98–111)
Creatinine: 0.7 mg/dL (ref 0.5–0.9)
Glucose: 93 mg/dL (ref 74–109)
Potassium: 4.8 meq/L (ref 3.5–5.2)
Sodium: 136 meq/L (ref 135–145)
Total Bilirubin: 0.6 mg/dL (ref 0.3–1.2)
Total Protein: 6.5 g/dL (ref 6.4–8.3)

## 2024-04-27 LAB — GLOMERULAR FILTRATION RATE, ESTIMATED: Est, Glom Filt Rate: >90 ml/min/1.73m2 (ref >60)

## 2024-04-27 LAB — URINALYSIS WITH REFLEX TO CULTURE
Bilirubin, Urine: NEGATIVE
Blood, Urine: NEGATIVE
Glucose, Ur: NEGATIVE mg/dL
Ketones, Urine: NEGATIVE
Leukocyte Esterase, Urine: NEGATIVE
Nitrite, Urine: NEGATIVE
Protein, UA: NEGATIVE
Specific Gravity, UA: 1.015 (ref 1.002–1.030)
Urobilinogen, Urine: 0.2 eu/dl (ref 0.0–1.0)
pH, Urine: 7 (ref 5.0–9.0)

## 2024-04-27 LAB — ANION GAP: Anion Gap: 9 meq/L (ref 8.0–16.0)

## 2024-04-28 LAB — C4 COMPLEMENT: Complement C4: 24 mg/dL (ref 10–40)

## 2024-04-28 LAB — C3 COMPLEMENT: C3 Complement: 56 mg/dL — ABNORMAL LOW (ref 90–180)

## 2024-04-29 LAB — COMPLEMENT, TOTAL: Compl, Total (CH50): 53 U/mL (ref 38.7–89.9)

## 2024-05-29 ENCOUNTER — Inpatient Hospital Stay: Admit: 2024-05-29 | Payer: MEDICARE

## 2024-05-29 DIAGNOSIS — M329 Systemic lupus erythematosus, unspecified: Principal | ICD-10-CM

## 2024-05-29 LAB — CBC WITH AUTO DIFFERENTIAL
Basophils Absolute: 0.1 thou/mm3 (ref 0.0–0.1)
Basophils: 0.7 %
Eosinophils Absolute: 0.2 thou/mm3 (ref 0.0–0.4)
Eosinophils: 2.4 %
Hematocrit: 39.2 % (ref 37.0–47.0)
Hemoglobin: 13.1 g/dL (ref 12.0–16.0)
Immature Grans (Abs): 0.03 thou/mm3 (ref 0.00–0.07)
Immature Granulocytes %: 0.4 %
Lymphocytes Absolute: 1.2 thou/mm3 (ref 1.0–4.8)
Lymphocytes: 13.9 %
MCH: 31.8 pg (ref 26.0–33.0)
MCHC: 33.4 g/dL (ref 32.2–35.5)
MCV: 95.1 fL (ref 81.0–99.0)
MPV: 12.3 fL (ref 9.4–12.4)
Monocytes %: 7.4 %
Monocytes Absolute: 0.6 thou/mm3 (ref 0.4–1.3)
Neutrophils Absolute: 6.4 thou/mm3 (ref 1.8–7.7)
Platelets: 49 thou/mm3 — ABNORMAL LOW (ref 130–400)
RBC: 4.12 mill/mm3 — ABNORMAL LOW (ref 4.20–5.40)
RDW-CV: 12.7 % (ref 11.5–14.5)
RDW-SD: 44.1 fL (ref 35.0–45.0)
Seg Neutrophils: 75.2 %
WBC: 8.5 thou/mm3 (ref 4.8–10.8)
nRBC: 0 /100{WBCs}

## 2024-05-29 LAB — SCAN OF BLOOD SMEAR

## 2024-05-29 LAB — URINALYSIS WITH REFLEX TO CULTURE
Bilirubin, Urine: NEGATIVE
Blood, Urine: NEGATIVE
Glucose, Ur: NEGATIVE mg/dL
Ketones, Urine: NEGATIVE
Leukocyte Esterase, Urine: NEGATIVE
Nitrite, Urine: NEGATIVE
Protein, UA: NEGATIVE
Specific Gravity, UA: 1.013 (ref 1.002–1.030)
Urobilinogen, Urine: 0.2 eu/dl (ref 0.0–1.0)
pH, Urine: 7 (ref 5.0–9.0)

## 2024-05-29 LAB — COMPREHENSIVE METABOLIC PANEL
ALT: 23 U/L (ref 10–35)
AST: 30 U/L (ref 10–35)
Albumin: 3.9 g/dL (ref 3.4–4.9)
Alkaline Phosphatase: 59 U/L (ref 38–126)
BUN: 17 mg/dL (ref 8–23)
CO2: 25 meq/L (ref 22–29)
Calcium: 9 mg/dL (ref 8.5–10.5)
Chloride: 99 meq/L (ref 98–111)
Creatinine: 0.7 mg/dL (ref 0.5–0.9)
Glucose: 85 mg/dL (ref 74–109)
Potassium: 4.1 meq/L (ref 3.5–5.2)
Sodium: 135 meq/L (ref 135–145)
Total Bilirubin: 0.6 mg/dL (ref 0.3–1.2)
Total Protein: 6.3 g/dL — ABNORMAL LOW (ref 6.4–8.3)

## 2024-05-29 LAB — SEDIMENTATION RATE: Sed Rate, Automated: 7 mm/h (ref 0–20)

## 2024-05-29 LAB — ANION GAP: Anion Gap: 11 meq/L (ref 8.0–16.0)

## 2024-05-29 LAB — GLOMERULAR FILTRATION RATE, ESTIMATED: Est, Glom Filt Rate: >90 ml/min/1.73m2 (ref >60)

## 2024-05-30 LAB — C4 COMPLEMENT: Complement C4: 21 mg/dL (ref 10–40)

## 2024-05-30 LAB — C3 COMPLEMENT: C3 Complement: 55 mg/dL — ABNORMAL LOW (ref 90–180)

## 2024-05-31 LAB — COMPLEMENT, TOTAL: Compl, Total (CH50): 51.2 U/mL (ref 38.7–89.9)

## 2024-06-29 ENCOUNTER — Inpatient Hospital Stay: Admit: 2024-06-29 | Payer: MEDICARE

## 2024-06-29 DIAGNOSIS — M810 Age-related osteoporosis without current pathological fracture: Principal | ICD-10-CM

## 2024-06-29 LAB — CBC WITH AUTO DIFFERENTIAL
Basophils Absolute: 0.1 thou/mm3 (ref 0.0–0.1)
Basophils: 1.1 %
Eosinophils Absolute: 0.4 thou/mm3 (ref 0.0–0.4)
Eosinophils: 6.7 %
Hematocrit: 40.2 % (ref 37.0–47.0)
Hemoglobin: 13.2 g/dL (ref 12.0–16.0)
Immature Grans (Abs): 0.01 thou/mm3 (ref 0.00–0.07)
Immature Granulocytes %: 0.2 %
Lymphocytes Absolute: 0.8 thou/mm3 — ABNORMAL LOW (ref 1.0–4.8)
Lymphocytes: 14.8 %
MCH: 31.1 pg (ref 26.0–33.0)
MCHC: 32.8 g/dL (ref 32.2–35.5)
MCV: 94.8 fL (ref 81.0–99.0)
MPV: 11.7 fL (ref 9.4–12.4)
Monocytes %: 10.3 %
Monocytes Absolute: 0.6 thou/mm3 (ref 0.4–1.3)
Neutrophils Absolute: 3.7 thou/mm3 (ref 1.8–7.7)
Platelets: 72 thou/mm3 — ABNORMAL LOW (ref 130–400)
RBC: 4.24 mill/mm3 (ref 4.20–5.40)
RDW-CV: 12.5 % (ref 11.5–14.5)
RDW-SD: 43.3 fL (ref 35.0–45.0)
Seg Neutrophils: 66.9 %
WBC: 5.6 thou/mm3 (ref 4.8–10.8)
nRBC: 0 /100{WBCs}

## 2024-06-29 LAB — ANION GAP: Anion Gap: 12 meq/L (ref 8.0–16.0)

## 2024-06-29 LAB — VITAMIN D 25 HYDROXY: Vit D, 25-Hydroxy: 55 ng/mL (ref 30–100)

## 2024-06-29 LAB — COMPREHENSIVE METABOLIC PANEL
ALT: 49 U/L — ABNORMAL HIGH (ref 10–35)
AST: 47 U/L — ABNORMAL HIGH (ref 10–35)
Albumin: 4.2 g/dL (ref 3.4–4.9)
Alkaline Phosphatase: 72 U/L (ref 38–126)
BUN: 18 mg/dL (ref 8–23)
CO2: 27 meq/L (ref 22–29)
Calcium: 9.2 mg/dL (ref 8.5–10.5)
Chloride: 98 meq/L (ref 98–111)
Creatinine: 0.8 mg/dL (ref 0.5–0.9)
Glucose: 82 mg/dL (ref 74–109)
Potassium: 3.9 meq/L (ref 3.5–5.2)
Sodium: 137 meq/L (ref 135–145)
Total Bilirubin: 0.7 mg/dL (ref 0.3–1.2)
Total Protein: 6.7 g/dL (ref 6.4–8.3)

## 2024-06-29 LAB — GLOMERULAR FILTRATION RATE, ESTIMATED: Est, Glom Filt Rate: 79 ml/min/1.73m2 (ref >60)

## 2024-06-29 LAB — PHOSPHORUS: Phosphorus: 3.5 mg/dL (ref 2.5–4.5)

## 2024-06-29 LAB — TSH REFLEX TO FT4: TSH: 2.63 u[IU]/mL (ref 0.27–4.20)

## 2024-06-29 LAB — PTH, INTACT: Pth Intact: 31 pg/mL (ref 15.0–65.0)

## 2024-06-29 LAB — CALCIUM, IONIZED: Calcium, Ionized: 1.15 mmol/L (ref 1.12–1.32)

## 2024-07-28 ENCOUNTER — Inpatient Hospital Stay: Admit: 2024-07-28 | Payer: MEDICARE

## 2024-07-28 DIAGNOSIS — M810 Age-related osteoporosis without current pathological fracture: Principal | ICD-10-CM

## 2024-07-28 LAB — CREATININE: Creatinine: 0.6 mg/dL (ref 0.5–0.9)

## 2024-07-28 LAB — URINALYSIS WITH REFLEX TO CULTURE
Bilirubin, Urine: NEGATIVE
Glucose, Ur: NEGATIVE mg/dL
Leukocyte Esterase, Urine: NEGATIVE
Nitrite, Urine: NEGATIVE

## 2024-07-28 LAB — ALT: ALT: 33 U/L (ref 10–35)

## 2024-07-28 LAB — SCAN OF BLOOD SMEAR

## 2024-07-29 LAB — CBC WITH AUTO DIFFERENTIAL
Basophils Absolute: 0.1 thou/mm3 (ref 0.0–0.1)
Basophils: 1.2 %
Eosinophils Absolute: 0.3 thou/mm3 (ref 0.0–0.4)
Eosinophils: 5.9 %
Hematocrit: 39.5 % (ref 37.0–47.0)
Hemoglobin: 13.2 g/dL (ref 12.0–16.0)
Immature Grans (Abs): 0.01 thou/mm3 (ref 0.00–0.07)
Immature Granulocytes %: 0.2 %
Lymphocytes Absolute: 1 thou/mm3 (ref 1.0–4.8)
Lymphocytes: 17 %
MCH: 31.4 pg (ref 26.0–33.0)
MCHC: 33.4 g/dL (ref 32.2–35.5)
MCV: 93.8 fL (ref 81.0–99.0)
MPV: 13.5 fL — ABNORMAL HIGH (ref 9.4–12.4)
Monocytes %: 10.4 %
Monocytes Absolute: 0.6 thou/mm3 (ref 0.4–1.3)
Neutrophils Absolute: 3.8 thou/mm3 (ref 1.8–7.7)
Platelet Estimate: DECREASED
Platelets: 41 thou/mm3 — ABNORMAL LOW (ref 130–400)
RBC: 4.21 mill/mm3 (ref 4.20–5.40)
RDW-CV: 12.5 % (ref 11.5–14.5)
RDW-SD: 43 fL (ref 35.0–45.0)
Seg Neutrophils: 65.3 %
WBC: 5.8 thou/mm3 (ref 4.8–10.8)
nRBC: 0 /100{WBCs}

## 2024-07-29 LAB — C4 COMPLEMENT: Complement C4: 21 mg/dL (ref 10–40)

## 2024-07-29 LAB — C3 COMPLEMENT: C3 Complement: 60 mg/dL — ABNORMAL LOW (ref 90–180)

## 2024-07-30 LAB — COMPLEMENT, TOTAL: Compl, Total (CH50): 36.1 U/mL — ABNORMAL LOW (ref 38.7–89.9)

## 2024-10-12 ENCOUNTER — Inpatient Hospital Stay: Admit: 2024-10-12 | Payer: MEDICARE

## 2024-10-12 DIAGNOSIS — M810 Age-related osteoporosis without current pathological fracture: Principal | ICD-10-CM

## 2024-10-13 LAB — CBC WITH AUTO DIFFERENTIAL
Basophils Absolute: 0 thou/mm3 (ref 0.0–0.1)
Basophils: 0.4 %
Eosinophils Absolute: 0.2 thou/mm3 (ref 0.0–0.4)
Eosinophils: 1.9 %
Hematocrit: 42.3 % (ref 37.0–47.0)
Hemoglobin: 13.9 g/dL (ref 12.0–16.0)
Immature Grans (Abs): 0.03 thou/mm3 (ref 0.00–0.07)
Immature Granulocytes %: 0.3 %
Lymphocytes Absolute: 0.5 thou/mm3 — ABNORMAL LOW (ref 1.0–4.8)
Lymphocytes: 5.1 %
MCH: 31.8 pg (ref 26.0–33.0)
MCHC: 32.9 g/dL (ref 32.2–35.5)
MCV: 96.8 fL (ref 81.0–99.0)
MPV: 12.8 fL — ABNORMAL HIGH (ref 9.4–12.4)
Monocytes %: 5.8 %
Monocytes Absolute: 0.6 thou/mm3 (ref 0.4–1.3)
Neutrophils Absolute: 8.7 thou/mm3 — ABNORMAL HIGH (ref 1.8–7.7)
Platelet Estimate: DECREASED
Platelets: 48 thou/mm3 — ABNORMAL LOW (ref 130–400)
RBC: 4.37 mill/mm3 (ref 4.20–5.40)
RDW-CV: 13.3 % (ref 11.5–14.5)
RDW-SD: 47.9 fL — ABNORMAL HIGH (ref 35.0–45.0)
Seg Neutrophils: 86.5 %
WBC: 10.1 thou/mm3 (ref 4.8–10.8)
nRBC: 0 /100{WBCs}

## 2024-10-13 LAB — SCAN OF BLOOD SMEAR
